# Patient Record
Sex: Female | Born: 1935 | Race: White | Hispanic: No | Marital: Married | Smoking: Never smoker
Health system: Southern US, Community
[De-identification: ages and names within clinical notes are randomized; demographics above are authoritative.]

## PROBLEM LIST (undated history)

## (undated) DIAGNOSIS — R51 Headache: Secondary | ICD-10-CM

## (undated) DIAGNOSIS — R05 Cough: Secondary | ICD-10-CM

## (undated) DIAGNOSIS — R519 Headache, unspecified: Secondary | ICD-10-CM

## (undated) DIAGNOSIS — I10 Essential (primary) hypertension: Secondary | ICD-10-CM

## (undated) DIAGNOSIS — B029 Zoster without complications: Secondary | ICD-10-CM

## (undated) DIAGNOSIS — G629 Polyneuropathy, unspecified: Secondary | ICD-10-CM

## (undated) DIAGNOSIS — R053 Chronic cough: Secondary | ICD-10-CM

## (undated) DIAGNOSIS — R600 Localized edema: Secondary | ICD-10-CM

## (undated) DIAGNOSIS — G709 Myoneural disorder, unspecified: Secondary | ICD-10-CM

## (undated) DIAGNOSIS — R002 Palpitations: Secondary | ICD-10-CM

## (undated) DIAGNOSIS — K219 Gastro-esophageal reflux disease without esophagitis: Secondary | ICD-10-CM

## (undated) DIAGNOSIS — R32 Unspecified urinary incontinence: Secondary | ICD-10-CM

## (undated) HISTORY — DX: Cough: R05

## (undated) HISTORY — DX: Polyneuropathy, unspecified: G62.9

## (undated) HISTORY — DX: Unspecified urinary incontinence: R32

## (undated) HISTORY — DX: Zoster without complications: B02.9

## (undated) HISTORY — DX: Chronic cough: R05.3

## (undated) HISTORY — DX: Headache: R51

## (undated) HISTORY — DX: Headache, unspecified: R51.9

## (undated) HISTORY — PX: TONSILLECTOMY: SUR1361

## (undated) HISTORY — DX: Localized edema: R60.0

## (undated) HISTORY — DX: Palpitations: R00.2

## (undated) HISTORY — DX: Essential (primary) hypertension: I10

## (undated) HISTORY — PX: TOE AMPUTATION: SHX809

---

## 1998-08-06 ENCOUNTER — Other Ambulatory Visit: Admission: RE | Admit: 1998-08-06 | Discharge: 1998-08-06 | Payer: Self-pay | Admitting: Gynecology

## 1998-12-02 ENCOUNTER — Ambulatory Visit: Admission: RE | Admit: 1998-12-02 | Discharge: 1998-12-02 | Payer: Self-pay | Admitting: Internal Medicine

## 1999-09-24 ENCOUNTER — Other Ambulatory Visit: Admission: RE | Admit: 1999-09-24 | Discharge: 1999-09-24 | Payer: Self-pay | Admitting: Gynecology

## 2000-09-26 ENCOUNTER — Other Ambulatory Visit: Admission: RE | Admit: 2000-09-26 | Discharge: 2000-09-26 | Payer: Self-pay | Admitting: Gynecology

## 2001-09-27 ENCOUNTER — Other Ambulatory Visit: Admission: RE | Admit: 2001-09-27 | Discharge: 2001-09-27 | Payer: Self-pay | Admitting: Gynecology

## 2002-10-01 ENCOUNTER — Other Ambulatory Visit: Admission: RE | Admit: 2002-10-01 | Discharge: 2002-10-01 | Payer: Self-pay | Admitting: Gynecology

## 2003-07-06 HISTORY — PX: COLONOSCOPY: SHX174

## 2003-10-04 ENCOUNTER — Other Ambulatory Visit: Admission: RE | Admit: 2003-10-04 | Discharge: 2003-10-04 | Payer: Self-pay | Admitting: Gynecology

## 2004-02-21 ENCOUNTER — Encounter: Admission: RE | Admit: 2004-02-21 | Discharge: 2004-02-21 | Payer: Self-pay | Admitting: Internal Medicine

## 2004-05-21 ENCOUNTER — Ambulatory Visit: Payer: Self-pay | Admitting: Internal Medicine

## 2004-06-10 ENCOUNTER — Ambulatory Visit: Payer: Self-pay | Admitting: Internal Medicine

## 2004-07-13 ENCOUNTER — Ambulatory Visit: Payer: Self-pay | Admitting: Internal Medicine

## 2004-08-10 ENCOUNTER — Ambulatory Visit: Payer: Self-pay | Admitting: Internal Medicine

## 2004-09-04 ENCOUNTER — Encounter: Admission: RE | Admit: 2004-09-04 | Discharge: 2004-09-04 | Payer: Self-pay | Admitting: Neurology

## 2005-05-12 ENCOUNTER — Ambulatory Visit: Payer: Self-pay | Admitting: Internal Medicine

## 2005-10-14 ENCOUNTER — Ambulatory Visit: Payer: Self-pay | Admitting: Internal Medicine

## 2005-12-03 ENCOUNTER — Other Ambulatory Visit: Admission: RE | Admit: 2005-12-03 | Discharge: 2005-12-03 | Payer: Self-pay | Admitting: Gynecology

## 2006-02-22 ENCOUNTER — Ambulatory Visit: Payer: Self-pay | Admitting: Internal Medicine

## 2006-05-19 ENCOUNTER — Ambulatory Visit: Payer: Self-pay | Admitting: Internal Medicine

## 2007-03-03 ENCOUNTER — Encounter: Payer: Self-pay | Admitting: Internal Medicine

## 2007-03-28 ENCOUNTER — Encounter: Payer: Self-pay | Admitting: Internal Medicine

## 2007-04-04 ENCOUNTER — Ambulatory Visit: Payer: Self-pay | Admitting: Internal Medicine

## 2007-04-04 DIAGNOSIS — R109 Unspecified abdominal pain: Secondary | ICD-10-CM | POA: Insufficient documentation

## 2007-04-04 DIAGNOSIS — N959 Unspecified menopausal and perimenopausal disorder: Secondary | ICD-10-CM | POA: Insufficient documentation

## 2007-04-04 DIAGNOSIS — R209 Unspecified disturbances of skin sensation: Secondary | ICD-10-CM

## 2007-05-03 ENCOUNTER — Ambulatory Visit: Payer: Self-pay | Admitting: Internal Medicine

## 2007-12-18 ENCOUNTER — Encounter: Payer: Self-pay | Admitting: Internal Medicine

## 2008-04-01 ENCOUNTER — Encounter: Payer: Self-pay | Admitting: Internal Medicine

## 2008-04-02 ENCOUNTER — Ambulatory Visit: Payer: Self-pay | Admitting: Internal Medicine

## 2008-04-02 DIAGNOSIS — R609 Edema, unspecified: Secondary | ICD-10-CM

## 2008-04-03 LAB — CONVERTED CEMR LAB
ALT: 13 units/L (ref 0–35)
ALT: 17 units/L (ref 0–35)
AST: 20 units/L (ref 0–37)
Albumin: 3.6 g/dL (ref 3.5–5.2)
Alkaline Phosphatase: 37 units/L — ABNORMAL LOW (ref 39–117)
BUN: 12 mg/dL (ref 6–23)
BUN: 13 mg/dL (ref 6–23)
Basophils Absolute: 0 10*3/uL (ref 0.0–0.1)
Bilirubin, Direct: 0.1 mg/dL (ref 0.0–0.3)
Chloride: 101 meq/L (ref 96–112)
Cholesterol: 183 mg/dL (ref 0–200)
Eosinophils Absolute: 0 10*3/uL (ref 0.0–0.7)
GFR calc non Af Amer: 75 mL/min
Glucose, Bld: 85 mg/dL (ref 70–99)
HCT: 36.5 % (ref 36.0–46.0)
HCT: 36.9 % (ref 36.0–46.0)
HDL: 63 mg/dL (ref >39.0)
HDL: 69.9 mg/dL (ref >39.0)
Hemoglobin: 12.6 g/dL (ref 12.0–15.0)
LDL Cholesterol: 109 mg/dL — ABNORMAL HIGH (ref 0–99)
Lymphocytes Relative: 35.9 % (ref 12.0–46.0)
MCHC: 34.6 g/dL (ref 30.0–36.0)
Neutro Abs: 3.3 10*3/uL (ref 1.4–7.7)
Neutro Abs: 4.3 10*3/uL (ref 1.4–7.7)
Platelets: 249 10*3/uL (ref 150–400)
Platelets: 280 10*3/uL (ref 150–400)
Potassium: 3.9 meq/L (ref 3.5–5.1)
RBC: 3.83 M/uL — ABNORMAL LOW (ref 3.87–5.11)
RBC: 3.86 M/uL — ABNORMAL LOW (ref 3.87–5.11)
RDW: 12.9 % (ref 11.5–14.6)
Total Bilirubin: 0.5 mg/dL (ref 0.3–1.2)
Total CHOL/HDL Ratio: 2.7
Total Protein: 6.4 g/dL (ref 6.0–8.3)
Triglycerides: 41 mg/dL (ref 0–149)
WBC: 6.1 10*3/uL (ref 4.5–10.5)

## 2008-05-28 ENCOUNTER — Ambulatory Visit: Payer: Self-pay | Admitting: Internal Medicine

## 2008-07-02 ENCOUNTER — Ambulatory Visit: Payer: Self-pay | Admitting: Family Medicine

## 2008-08-16 ENCOUNTER — Ambulatory Visit: Payer: Self-pay | Admitting: Internal Medicine

## 2008-08-16 DIAGNOSIS — K591 Functional diarrhea: Secondary | ICD-10-CM | POA: Insufficient documentation

## 2008-10-24 ENCOUNTER — Ambulatory Visit: Payer: Self-pay | Admitting: Family Medicine

## 2008-10-25 LAB — CONVERTED CEMR LAB
BUN: 14 mg/dL (ref 6–23)
Calcium: 9.5 mg/dL (ref 8.4–10.5)
GFR calc non Af Amer: 87.73 mL/min (ref >60)
Glucose, Bld: 102 mg/dL — ABNORMAL HIGH (ref 70–99)

## 2008-11-27 ENCOUNTER — Encounter: Payer: Self-pay | Admitting: Internal Medicine

## 2008-12-27 ENCOUNTER — Encounter: Payer: Self-pay | Admitting: Internal Medicine

## 2008-12-30 ENCOUNTER — Ambulatory Visit: Payer: Self-pay | Admitting: Internal Medicine

## 2008-12-30 DIAGNOSIS — I10 Essential (primary) hypertension: Secondary | ICD-10-CM

## 2008-12-30 DIAGNOSIS — R519 Headache, unspecified: Secondary | ICD-10-CM | POA: Insufficient documentation

## 2008-12-30 DIAGNOSIS — R51 Headache: Secondary | ICD-10-CM

## 2009-01-08 ENCOUNTER — Ambulatory Visit: Payer: Self-pay | Admitting: Internal Medicine

## 2009-01-08 DIAGNOSIS — R05 Cough: Secondary | ICD-10-CM

## 2009-02-03 ENCOUNTER — Ambulatory Visit: Payer: Self-pay | Admitting: Internal Medicine

## 2009-03-07 ENCOUNTER — Ambulatory Visit: Payer: Self-pay | Admitting: Internal Medicine

## 2009-03-27 ENCOUNTER — Telehealth: Payer: Self-pay | Admitting: Internal Medicine

## 2009-04-03 ENCOUNTER — Ambulatory Visit: Payer: Self-pay | Admitting: Pulmonary Disease

## 2009-04-28 ENCOUNTER — Ambulatory Visit: Payer: Self-pay | Admitting: Pulmonary Disease

## 2009-05-01 ENCOUNTER — Ambulatory Visit: Payer: Self-pay | Admitting: Internal Medicine

## 2009-05-12 ENCOUNTER — Telehealth (INDEPENDENT_AMBULATORY_CARE_PROVIDER_SITE_OTHER): Payer: Self-pay | Admitting: *Deleted

## 2009-05-19 ENCOUNTER — Telehealth: Payer: Self-pay | Admitting: Pulmonary Disease

## 2009-05-23 ENCOUNTER — Telehealth: Payer: Self-pay | Admitting: Internal Medicine

## 2009-05-26 ENCOUNTER — Encounter (INDEPENDENT_AMBULATORY_CARE_PROVIDER_SITE_OTHER): Payer: Self-pay | Admitting: *Deleted

## 2009-05-27 ENCOUNTER — Encounter: Payer: Self-pay | Admitting: Internal Medicine

## 2009-05-28 ENCOUNTER — Ambulatory Visit: Payer: Self-pay | Admitting: Internal Medicine

## 2009-05-28 LAB — CONVERTED CEMR LAB
Chloride: 101 meq/L (ref 96–112)
GFR calc non Af Amer: 65.53 mL/min (ref >60)
Sodium: 138 meq/L (ref 135–145)

## 2009-06-02 ENCOUNTER — Encounter (INDEPENDENT_AMBULATORY_CARE_PROVIDER_SITE_OTHER): Payer: Self-pay | Admitting: *Deleted

## 2009-06-03 ENCOUNTER — Telehealth: Payer: Self-pay | Admitting: Internal Medicine

## 2009-07-10 ENCOUNTER — Telehealth: Payer: Self-pay | Admitting: Internal Medicine

## 2009-07-21 ENCOUNTER — Telehealth (INDEPENDENT_AMBULATORY_CARE_PROVIDER_SITE_OTHER): Payer: Self-pay | Admitting: *Deleted

## 2009-08-20 ENCOUNTER — Ambulatory Visit (HOSPITAL_BASED_OUTPATIENT_CLINIC_OR_DEPARTMENT_OTHER): Admission: RE | Admit: 2009-08-20 | Discharge: 2009-08-20 | Payer: Self-pay | Admitting: Gynecology

## 2009-08-22 ENCOUNTER — Ambulatory Visit: Payer: Self-pay | Admitting: Internal Medicine

## 2009-09-08 ENCOUNTER — Encounter: Payer: Self-pay | Admitting: Internal Medicine

## 2009-10-06 ENCOUNTER — Ambulatory Visit: Payer: Self-pay | Admitting: Family Medicine

## 2009-10-15 ENCOUNTER — Ambulatory Visit: Payer: Self-pay | Admitting: Internal Medicine

## 2009-11-06 ENCOUNTER — Telehealth: Payer: Self-pay | Admitting: Internal Medicine

## 2009-12-29 ENCOUNTER — Encounter: Payer: Self-pay | Admitting: Internal Medicine

## 2010-02-18 ENCOUNTER — Ambulatory Visit: Payer: Self-pay | Admitting: Family Medicine

## 2010-02-18 DIAGNOSIS — B029 Zoster without complications: Secondary | ICD-10-CM | POA: Insufficient documentation

## 2010-02-27 ENCOUNTER — Telehealth: Payer: Self-pay | Admitting: Family Medicine

## 2010-03-05 ENCOUNTER — Telehealth: Payer: Self-pay | Admitting: Family Medicine

## 2010-03-12 ENCOUNTER — Ambulatory Visit: Payer: Self-pay | Admitting: Internal Medicine

## 2010-03-24 ENCOUNTER — Telehealth: Payer: Self-pay | Admitting: Internal Medicine

## 2010-05-09 IMAGING — CR DG CHEST 2V
2 series · 2 of 2 positions shown · non-contrast
Comparison: 02/20/2004

CLINICAL DATA: Cough.

CHEST - 2 VIEW

[view not recorded (1 of 2)]
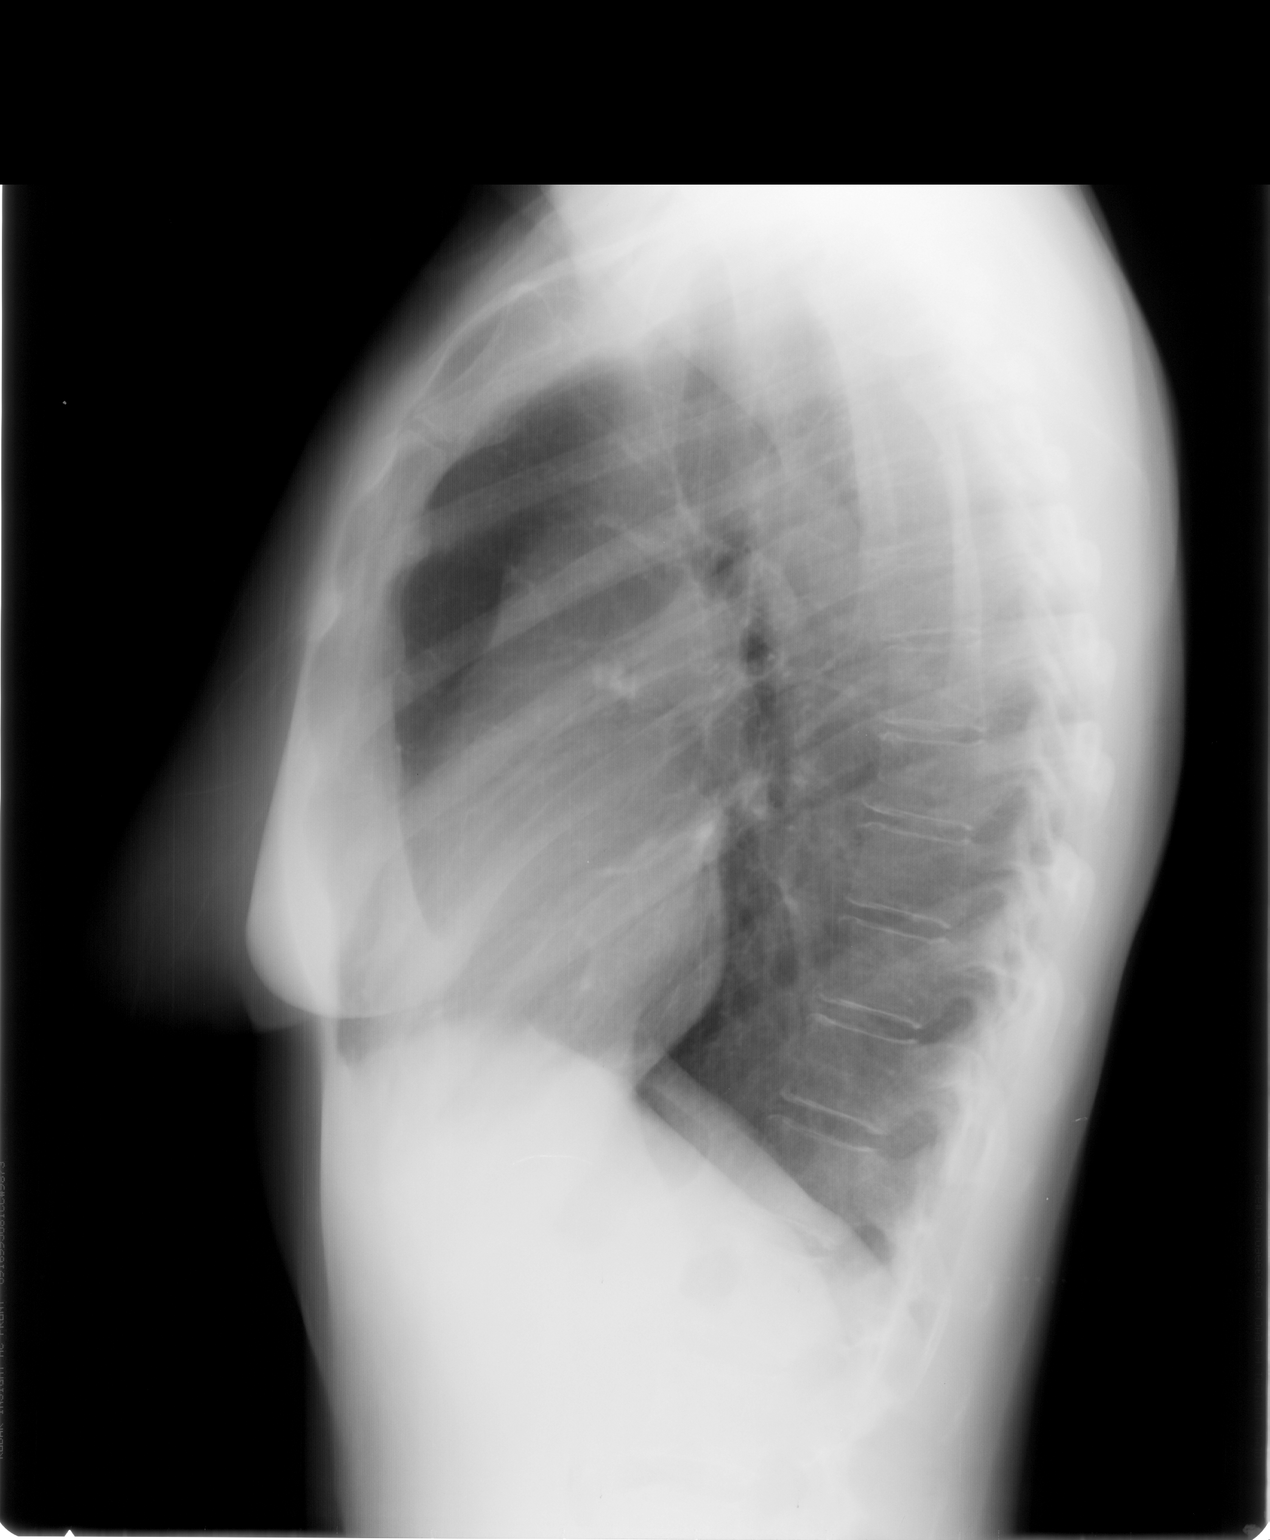

[view not recorded (2 of 2)]
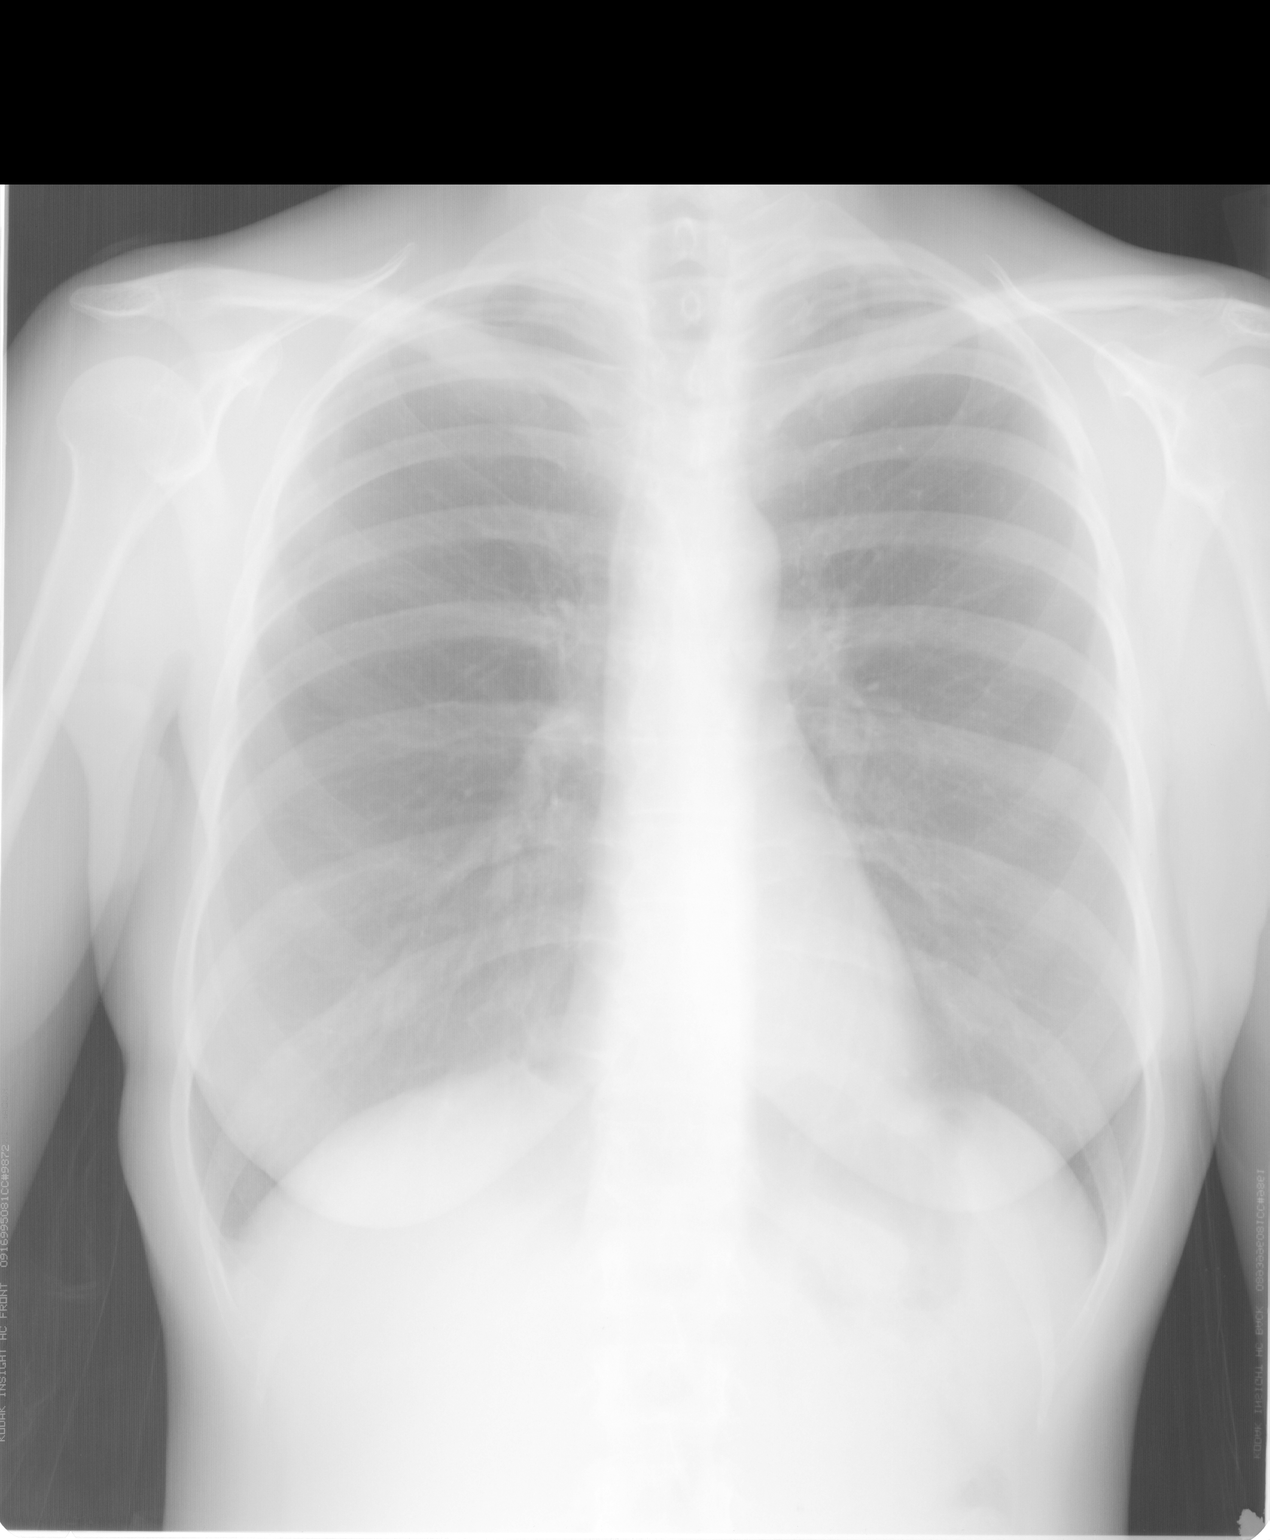

[2 of 2 positions shown; findings below may reference images not displayed]

FINDINGS: Trachea is midline.  Heart size normal.  There may be a
calcified right hilar lymph node.  Mild biapical pleural
parenchymal scarring.  Lungs are otherwise clear.  No pleural
fluid.
IMPRESSION: No acute findings.

## 2010-06-19 ENCOUNTER — Ambulatory Visit: Payer: Self-pay | Admitting: Internal Medicine

## 2010-06-22 ENCOUNTER — Telehealth: Payer: Self-pay | Admitting: Internal Medicine

## 2010-07-05 HISTORY — PX: MYOMECTOMY VAGINAL APPROACH: SUR871

## 2010-07-15 ENCOUNTER — Other Ambulatory Visit: Payer: Self-pay | Admitting: Internal Medicine

## 2010-07-15 ENCOUNTER — Encounter: Payer: Self-pay | Admitting: Internal Medicine

## 2010-07-15 LAB — CBC WITH DIFFERENTIAL/PLATELET
Basophils Absolute: 0 10*3/uL (ref 0.0–0.1)
Basophils Relative: 0.6 % (ref 0.0–3.0)
Eosinophils Absolute: 0.1 10*3/uL (ref 0.0–0.7)
Eosinophils Relative: 0.7 % (ref 0.0–5.0)
HCT: 39.6 % (ref 36.0–46.0)
Hemoglobin: 13.3 g/dL (ref 12.0–15.0)
Lymphocytes Relative: 27.9 % (ref 12.0–46.0)
Lymphs Abs: 2.3 10*3/uL (ref 0.7–4.0)
MCHC: 33.5 g/dL (ref 30.0–36.0)
MCV: 96.7 fl (ref 78.0–100.0)
Monocytes Absolute: 0.5 10*3/uL (ref 0.1–1.0)
Monocytes Relative: 6.8 % (ref 3.0–12.0)
Neutro Abs: 5.2 10*3/uL (ref 1.4–7.7)
Neutrophils Relative %: 64 % (ref 43.0–77.0)
Platelets: 334 10*3/uL (ref 150.0–400.0)
RBC: 4.1 Mil/uL (ref 3.87–5.11)
RDW: 13.5 % (ref 11.5–14.6)
WBC: 8.1 10*3/uL (ref 4.5–10.5)

## 2010-07-15 LAB — LIPID PANEL
Cholesterol: 183 mg/dL (ref 0–200)
HDL: 66.7 mg/dL (ref >39.00)
LDL Cholesterol: 109 mg/dL — ABNORMAL HIGH (ref 0–99)
Total CHOL/HDL Ratio: 3
Triglycerides: 38 mg/dL (ref 0.0–149.0)
VLDL: 7.6 mg/dL (ref 0.0–40.0)

## 2010-07-15 LAB — BASIC METABOLIC PANEL
BUN: 17 mg/dL (ref 6–23)
CO2: 31 mEq/L (ref 19–32)
Calcium: 9.5 mg/dL (ref 8.4–10.5)
Chloride: 103 mEq/L (ref 96–112)
Creatinine, Ser: 0.8 mg/dL (ref 0.4–1.2)
GFR: 78.21 mL/min (ref >60.00)
Glucose, Bld: 88 mg/dL (ref 70–99)
Potassium: 5.1 mEq/L (ref 3.5–5.1)
Sodium: 141 mEq/L (ref 135–145)

## 2010-07-15 LAB — HEPATIC FUNCTION PANEL
ALT: 13 U/L (ref 0–35)
AST: 19 U/L (ref 0–37)
Albumin: 4 g/dL (ref 3.5–5.2)
Alkaline Phosphatase: 46 U/L (ref 39–117)
Bilirubin, Direct: 0.1 mg/dL (ref 0.0–0.3)
Total Bilirubin: 0.7 mg/dL (ref 0.3–1.2)
Total Protein: 7.3 g/dL (ref 6.0–8.3)

## 2010-07-15 LAB — TSH: TSH: 2.02 u[IU]/mL (ref 0.35–5.50)

## 2010-08-02 LAB — CONVERTED CEMR LAB
AST: 23 units/L (ref 0–37)
Albumin: 4 g/dL (ref 3.5–5.2)
Bilirubin, Direct: 0 mg/dL (ref 0.0–0.3)
CO2: 28 meq/L (ref 19–32)
Calcium: 8.6 mg/dL (ref 8.4–10.5)
Cholesterol: 181 mg/dL (ref 0–200)
Eosinophils Absolute: 0 10*3/uL (ref 0.0–0.7)
Eosinophils Relative: 0.7 % (ref 0.0–5.0)
HDL: 62.4 mg/dL (ref >39.00)
LDL Cholesterol: 101 mg/dL — ABNORMAL HIGH (ref 0–99)
MCHC: 35.2 g/dL (ref 30.0–36.0)
MCV: 98 fL (ref 78.0–100.0)
Monocytes Absolute: 0.6 10*3/uL (ref 0.1–1.0)
Neutro Abs: 4 10*3/uL (ref 1.4–7.7)
Neutrophils Relative %: 58.2 % (ref 43.0–77.0)
Platelets: 265 10*3/uL (ref 150.0–400.0)
Potassium: 4.5 meq/L (ref 3.5–5.1)
RBC: 3.57 M/uL — ABNORMAL LOW (ref 3.87–5.11)
RDW: 12.9 % (ref 11.5–14.6)
TSH: 2.29 microintl units/mL (ref 0.35–5.50)
Total CHOL/HDL Ratio: 3
Total Protein: 7.2 g/dL (ref 6.0–8.3)
VLDL: 18 mg/dL (ref 0.0–40.0)

## 2010-08-04 NOTE — Progress Notes (Signed)
Summary: Pt req new script for HCTZ or water pill  Phone Note Call from Patient Call back at Home Phone [phone]   Caller: Patient Summary of Call: Pt is req a new script for HCTZ or another fluid pill. Pt says that they are starting to retain a lot of fluid again. Please call in to [facility].  Initial call taken by: Lucy Antigua,  July 10, 2009 11:03 AM  Follow-up for Phone Call        HCTZ 25 mg, number 90- one  every morning Follow-up by: Gordy Savers  MD,  July 10, 2009 12:48 PM  Additional Follow-up for Phone Call Additional follow up Details #1::        Rx Called In Additional Follow-up by: Raechel Ache, RN,  July 10, 2009 1:06 PM    New/Updated Medications: HYDROCHLOROTHIAZIDE 25 MG TABS (HYDROCHLOROTHIAZIDE) 1 once daily Prescriptions: HYDROCHLOROTHIAZIDE 25 MG TABS (HYDROCHLOROTHIAZIDE) 1 once daily  #90 x 2   Entered by:   Raechel Ache, RN   Authorized by:   Gordy Savers  MD   Signed by:   Raechel Ache, RN on 07/10/2009   Method used:   Electronically to        [facility]* (retail)       [address]       [place], [place]  [id number]       Ph: [phone]       Fax: [phone]   RxID:   [id number] HYDROCHLOROTHIAZIDE 25 MG TABS (HYDROCHLOROTHIAZIDE) 1 once daily  #90 x 2   Entered by:   Raechel Ache, RN   Authorized by:   Gordy Savers  MD   Signed by:   Raechel Ache, RN on 07/10/2009   Method used:   Print then Give to Patient   RxID:   [id number]

## 2010-08-04 NOTE — Assessment & Plan Note (Signed)
Summary: shingles/dm   Vital Signs:  Patient profile:   75 year old female Weight:      156 pounds Temp:     97.6 degrees F oral BP sitting:   110 / 72  (right arm) Cuff size:   regular  Vitals Entered By: Duard Brady LPN (March 12, 2010 11:44 AM) CC: c/o shingles - burning , spreading Is Patient Diabetic? No   Primary Care Provider:  Gordy Savers  MD  CC:  c/o shingles - burning  and spreading.  History of Present Illness: 75 year-old patient who is seen today for follow-up.  She was seen earlier with possible recurrent shingles.  She had a vesicular rash involving her left posterior shoulder region.  This has healed and she now complains of some fairly generalized paresthesias described as a burning sensation.  These are not well localized.  She has a history of similar paresthesias in the past and seen by neurology and placed on nortriptyline.  She has treated hypertension  Allergies: 1)  ! * Singular 2)  ! * Zyrtec 3)  ! * Fluticason Propionate 4)  Cipro 5)  Naprosyn (Naproxen)  Physical Exam  General:  Well-developed,well-nourished,in no acute distress; alert,appropriate and cooperative throughout examination; normal blood pressure Neck:  No deformities, masses, or tenderness noted. Lungs:  Normal respiratory effort, chest expands symmetrically. Lungs are clear to auscultation, no crackles or wheezes. Heart:  Normal rate and regular rhythm. S1 and S2 normal without gallop, murmur, click, rub or other extra sounds. Skin:  no dermatitis   Impression & Recommendations:  Problem # 1:  HYPERTENSION (ICD-401.9)  Her updated medication list for this problem includes:    Micardis 40 Mg Tabs (Telmisartan) .Marland Kitchen... 1 once daily    Aldactone 25 Mg Tabs (Spironolactone) ..... One daily  Her updated medication list for this problem includes:    Micardis 40 Mg Tabs (Telmisartan) .Marland Kitchen... 1 once daily    Aldactone 25 Mg Tabs (Spironolactone) ..... One  daily  Problem # 2:  DYSESTHESIA (ICD-782.0)  Complete Medication List: 1)  Aspirin Ec 81 Mg Tbec (Aspirin) .... Take 1 tablet by mouth once a day 2)  Prometrium 200 Mg Caps (Progesterone micronized) .... Take days 1-12 every other month 3)  Vivelle 0.05 Mg/24hr Pttw (Estradiol) .... Apply 2x week 4)  Premarin 0.625 Mg/gm Crea (Estrogens, conjugated) .... Use 2x week 5)  Chlorpheniramine Maleate 4 Mg Tabs (Chlorpheniramine maleate) .... Take 2 tabs by mouth at bedtime 6)  Micardis 40 Mg Tabs (Telmisartan) .Marland Kitchen.. 1 once daily 7)  Aldactone 25 Mg Tabs (Spironolactone) .... One daily 8)  Furosemide 20 Mg Tabs (furosemide)  .... One by mouth q day 9)  Vitamin D 1000 Unit Tabs (Cholecalciferol) .... Qd 10)  Caltrate 600 1500 Mg Tabs (Calcium carbonate) 11)  Amitriptyline Hcl 10 Mg Tabs (Amitriptyline hcl) .Marland Kitchen.. 1 tab by mouth at bedtime as needed pain 12)  Valacyclovir Hcl 500 Mg Tabs (Valacyclovir hcl) .Marland Kitchen.. 1 tablet by mouth three times a day x7 days  Patient Instructions: 1)  Please schedule a follow-up appointment in 4 months. 2)  Limit your Sodium (Salt) to less than 2 grams a day(slightly less than 1/2 a teaspoon) to prevent fluid retention, swelling, or worsening of symptoms. 3)  It is important that you exercise regularly at least 20 minutes 5 times a week. If you develop chest pain, have severe difficulty breathing, or feel very tired , stop exercising immediately and seek medical attention. Prescriptions: FUROSEMIDE  20 MG TABS (FUROSEMIDE) one by mouth q day  #90 x 2   Entered and Authorized by:   Gordy Savers  MD   Signed by:   Gordy Savers  MD on 03/12/2010   Method used:   Print then Give to Patient   RxID:   [id number]

## 2010-08-04 NOTE — Assessment & Plan Note (Signed)
Summary: ? shingles//ccm   Vital Signs:  Patient profile:   75 year old female Temp:     98.0 degrees F oral BP sitting:   140 / 80  (left arm) Cuff size:   regular  Vitals Entered By: Sid Falcon LPN (October 06, 1608 4:24 PM) CC: pt questioning shingles   History of Present Illness: Patient seen with burning stinging pain right thoracic region but no rash. History of similar symptoms in the past with shingles. Symptoms present for several days possibly over a week. Advil helps slightly. Symptoms relatively mild at this time. Denies any cough, dyspnea, or pleuritic pain.  Allergies: 1)  Cipro 2)  Naprosyn (Naproxen)  Past History:  Past Medical History: Last updated: 03/07/2009 Dysesthesias Menopausal syndrome Palpitations, hx history of shingles gravida two, para two, abortus zero pedal edema Headache Hypertension chronic cough PMH reviewed for relevance  Review of Systems  The patient denies anorexia, fever, weight loss, chest pain, syncope, dyspnea on exertion, prolonged cough, and hemoptysis.    Physical Exam  General:  Well-developed,well-nourished,in no acute distress; alert,appropriate and cooperative throughout examination Lungs:  Normal respiratory effort, chest expands symmetrically. Lungs are clear to auscultation, no crackles or wheezes. Heart:  normal rate and regular rhythm.   Msk:  slightly tender R thoracic region. Skin:  no visible rash   Impression & Recommendations:  Problem # 1:  DYSESTHESIA (ICD-782.0) patient has neuropathic-type pain right thoracic area but no clinical evidence for shingles at this time. Discussed possible treatment options but at this point she wishes to continue with over-the-counter Tylenol and Advil which are giving sufficient pain relief.  Complete Medication List: 1)  Aspirin Ec 81 Mg Tbec (Aspirin) .... Take 1 tablet by mouth once a day 2)  Prometrium 200 Mg Caps (Progesterone micronized) .... Take days 1-12 every  other month 3)  Vivelle 0.05 Mg/24hr Pttw (Estradiol) .... Apply 2x week 4)  Premarin 0.625 Mg/gm Crea (Estrogens, conjugated) .... Use 2x week 5)  Hydrocodone-homatropine 5-1.5 Mg/48ml Syrp (Hydrocodone-homatropine) .Marland Kitchen.. 1 teaspoon every 6 hours as needed for cough 6)  Omeprazole 40 Mg Cpdr (Omeprazole) .... Take one tablet once daily 7)  Chlorpheniramine Maleate 4 Mg Tabs (Chlorpheniramine maleate) .... Take 2 tabs by mouth at bedtime 8)  Astepro 0.15 % Soln (Azelastine hcl) .... 2 puffs each nostril in the am 9)  Micardis 40 Mg Tabs (Telmisartan) .Marland Kitchen.. 1 once daily 10)  Aldactone 25 Mg Tabs (Spironolactone) .... One daily 11)  Furosemide 20 Mg Tabs (Furosemide) .... One daily

## 2010-08-04 NOTE — Progress Notes (Signed)
Summary: edema  Phone Note Call from Patient   Caller: Patient Call For: Gordy Savers  MD Summary of Call: Both feet and ankles and hands are swelling.  Taking Lasix 20 mg. one by mouth daily. Wants to increase dosage...... [phone]  Initial call taken by: Lynann Beaver CMA,  Nov 06, 2009 11:05 AM  Follow-up for Phone Call        OK to increase to 40 mg daily Follow-up by: Gordy Savers  MD,  Nov 06, 2009 12:55 PM    New/Updated Medications: FUROSEMIDE 40 MG TABS (FUROSEMIDE) one by mouth q day   Pt. notified.

## 2010-08-04 NOTE — Assessment & Plan Note (Signed)
Summary: FEET SWOLLEN CAN'T MOVE TOES//SLM   Vital Signs:  Patient profile:   75 year old female Weight:      154 pounds Temp:     98.9 degrees F oral BP sitting:   136 / 70  (right arm) Cuff size:   regular  Vitals Entered By: Duard Brady LPN (August 22, 2009 9:23 AM) CC: c/o edema bilat  feet and legs Is Patient Diabetic? No   Primary Care Provider:  Gordy Savers  MD  CC:  c/o edema bilat  feet and legs.  History of Present Illness: 75 year old patient who is seen today with a chief complaint of worsening pedal edema.  This has been a issue in the past.  Denies any shortness of breath, PND, dyspnea on exertion.  She does moderate her salt intake.  She is two days status post a gynecologic procedure as an outpatient for postmenopausal bleeding.  She has done well postop.  She has treated hypertension, which has been stable.  Hypertensive regimen includes Micardis, and Aldactone.  Preventive Screening-Counseling & Management  Alcohol-Tobacco     Smoking Status: never  Allergies: 1)  Cipro 2)  Naprosyn (Naproxen)  Past History:  Past Medical History: Reviewed history from 03/07/2009 and no changes required. Dysesthesias Menopausal syndrome Palpitations, hx history of shingles gravida two, para two, abortus zero pedal edema Headache Hypertension chronic cough  Review of Systems       The patient complains of peripheral edema.  The patient denies anorexia, fever, weight loss, weight gain, vision loss, decreased hearing, hoarseness, chest pain, syncope, dyspnea on exertion, prolonged cough, headaches, hemoptysis, abdominal pain, melena, hematochezia, severe indigestion/heartburn, hematuria, incontinence, genital sores, muscle weakness, suspicious skin lesions, transient blindness, difficulty walking, depression, unusual weight change, abnormal bleeding, enlarged lymph nodes, angioedema, and breast masses.    Physical Exam  General:   Well-developed,well-nourished,in no acute distress; alert,appropriate and cooperative throughout examination Head:  Normocephalic and atraumatic without obvious abnormalities. No apparent alopecia or balding. Eyes:  No corneal or conjunctival inflammation noted. EOMI. Perrla. Funduscopic exam benign, without hemorrhages, exudates or papilledema. Vision grossly normal. Mouth:  Oral mucosa and oropharynx without lesions or exudates.  Teeth in good repair. Neck:  No deformities, masses, or tenderness noted. Lungs:  Normal respiratory effort, chest expands symmetrically. Lungs are clear to auscultation, no crackles or wheezes. Heart:  Normal rate and regular rhythm. S1 and S2 normal without gallop, murmur, click, rub or other extra sounds. Abdomen:  Bowel sounds positive,abdomen soft and non-tender without masses, organomegaly or hernias noted. Extremities:  2+ left pedal edema and 2+ right pedal edema.  2+ left pedal edema.     Impression & Recommendations:  Problem # 1:  PEDAL EDEMA (ICD-782.3)  Her updated medication list for this problem includes:    Aldactone 25 Mg Tabs (Spironolactone) ..... One daily    Furosemide 20 Mg Tabs (Furosemide) ..... One daily  Her updated medication list for this problem includes:    Aldactone 25 Mg Tabs (Spironolactone) ..... One daily    Furosemide 20 Mg Tabs (Furosemide) ..... One daily  Problem # 2:  HYPERTENSION (ICD-401.9)  Her updated medication list for this problem includes:    Micardis 40 Mg Tabs (Telmisartan) .Marland Kitchen... 1 once daily    Aldactone 25 Mg Tabs (Spironolactone) ..... One daily    Furosemide 20 Mg Tabs (Furosemide) ..... One daily  Her updated medication list for this problem includes:    Micardis 40 Mg Tabs (Telmisartan) .Marland Kitchen... 1 once daily  Aldactone 25 Mg Tabs (Spironolactone) ..... One daily    Furosemide 20 Mg Tabs (Furosemide) ..... One daily  Complete Medication List: 1)  Aspirin Ec 81 Mg Tbec (Aspirin) .... Take 1 tablet by  mouth once a day 2)  Prometrium 200 Mg Caps (Progesterone micronized) .... Take days 1-12 every other month 3)  Vivelle 0.05 Mg/24hr Pttw (Estradiol) .... Apply 2x week 4)  Premarin 0.625 Mg/gm Crea (Estrogens, conjugated) .... Use 2x week 5)  Hydrocodone-homatropine 5-1.5 Mg/19ml Syrp (Hydrocodone-homatropine) .Marland Kitchen.. 1 teaspoon every 6 hours as needed for cough 6)  Omeprazole 40 Mg Cpdr (Omeprazole) .... Take one tablet once daily 7)  Chlorpheniramine Maleate 4 Mg Tabs (Chlorpheniramine maleate) .... Take 2 tabs by mouth at bedtime 8)  Astepro 0.15 % Soln (Azelastine hcl) .... 2 puffs each nostril in the am 9)  Micardis 40 Mg Tabs (Telmisartan) .Marland Kitchen.. 1 once daily 10)  Aldactone 25 Mg Tabs (Spironolactone) .... One daily 11)  Furosemide 20 Mg Tabs (Furosemide) .... One daily  Patient Instructions: 1)  Please schedule a follow-up appointment in 6 months. 2)  Limit your Sodium (Salt). 3)  It is important that you exercise regularly at least 20 minutes 5 times a week. If you develop chest pain, have severe difficulty breathing, or feel very tired , stop exercising immediately and seek medical attention. Prescriptions: FUROSEMIDE 20 MG TABS (FUROSEMIDE) one daily  #90 x 4   Entered and Authorized by:   Gordy Savers  MD   Signed by:   Gordy Savers  MD on 08/22/2009   Method used:   Print then Give to Patient   RxID:   [id number] ALDACTONE 25 MG TABS (SPIRONOLACTONE) One daily  #90 x 4   Entered and Authorized by:   Gordy Savers  MD   Signed by:   Gordy Savers  MD on 08/22/2009   Method used:   Print then Give to Patient   RxID:   [id number] MICARDIS 40 MG TABS (TELMISARTAN) 1 once daily  #90 x 4   Entered and Authorized by:   Gordy Savers  MD   Signed by:   Gordy Savers  MD on 08/22/2009   Method used:   Print then Give to Patient   RxID:   [id number] OMEPRAZOLE 40 MG CPDR (OMEPRAZOLE) take one tablet once daily  #90 x 3   Entered  and Authorized by:   Gordy Savers  MD   Signed by:   Gordy Savers  MD on 08/22/2009   Method used:   Print then Give to Patient   RxID:   [id number]

## 2010-08-04 NOTE — Progress Notes (Signed)
  Phone Note Refill Request Message from:  Fax from Pharmacy on March 05, 2010 1:40 PM  Refills Requested: Medication #1:  VALACYCLOVIR HCL 500 MG TABS 1 tablet by mouth three times a day X7 days.   Dosage confirmed as above?Dosage Confirmed Initial call taken by: Josph Macho RMA,  March 05, 2010 1:41 PM    Prescriptions: VALACYCLOVIR HCL 500 MG TABS (VALACYCLOVIR HCL) 1 tablet by mouth three times a day X7 days  #21 x 0   Entered by:   Josph Macho RMA   Authorized by:   Danise Edge MD   Signed by:   Josph Macho RMA on 03/05/2010   Method used:   Faxed to ...       [facility]* (retail)       [address]       [place], [place]  [address]       Ph: [phone]       Fax: [phone]   RxID:   [id number]

## 2010-08-04 NOTE — Progress Notes (Signed)
Summary: refill of antiviral blyth saw for k  Phone Note Call from Patient Call back at Home Phone [phone]   Call For: blyth saw her when Dr. Kirtland Bouchard out Summary of Call: Has completed antiviral med Valacyclovir hcl 500mg .  Sores still breaking out on other places of body, left shoulder, one center of back, and 3 on left side.   Burning & stinging is better.  Still very sensitive to clothing.  Cannot wear bra, thinks one at bra line.  Should she have a refill of med?   [place].  Allergic to meds, see file.  Initial call taken by: Rudy Jew, RN,  February 27, 2010 9:26 AM  Follow-up for Phone Call        OK to refill once if no relief then needs reeval and likely referral to dermatology. Can increase Amitriptyline to 25mg  at bedtime if pain is keeping her up would give #30, 1 rf (may take 2 of the 10mg  tabs til gone) Follow-up by: Danise Edge MD,  February 27, 2010 9:36 AM  Additional Follow-up for Phone Call Additional follow up Details #1::        Informed pt and she stated she hasn't taken any of the Amitriptyline yet.  Additional Follow-up by: Josph Macho RMA,  February 27, 2010 10:06 AM    New/Updated Medications: VALACYCLOVIR HCL 500 MG TABS (VALACYCLOVIR HCL) 1 tablet by mouth three times a day X7 days Prescriptions: VALACYCLOVIR HCL 500 MG TABS (VALACYCLOVIR HCL) 1 tablet by mouth three times a day X7 days  #21 x 0   Entered by:   Josph Macho RMA   Authorized by:   Danise Edge MD   Signed by:   Josph Macho RMA on 02/27/2010   Method used:   Electronically to        [facility]* (retail)       [address]       [place], [place]  [id number]       Ph: [phone]       Fax: [phone]   RxID:   [id number]

## 2010-08-04 NOTE — Progress Notes (Signed)
Summary: different diuretic  Phone Note Call from Patient Call back at Home Phone [phone]   Caller: vm Call For: kwia Summary of Call: Thinks the HCT since Dec. is the cause of her persistent cough.  Stopped it Thurs & cough is virtually gone.  Requesting different diuretic for some swelling, though it's better than a week ago.   Initial call taken by: Rudy Jew, RN,  July 21, 2009 10:05 AM  Follow-up for Phone Call        Pharmacy is [facility].  Please call.    Additional Follow-up for Phone Call Additional follow up Details #1::        Phone Call Completed Additional Follow-up by: Rudy Jew, RN,  July 21, 2009 4:57 PM    New/Updated Medications: ALDACTONE 25 MG TABS (SPIRONOLACTONE) One daily Prescriptions: ALDACTONE 25 MG TABS (SPIRONOLACTONE) One daily  #90 x 0   Entered by:   Rudy Jew, RN   Authorized by:   Gordy Savers  MD   Signed by:   Rudy Jew, RN on 07/21/2009   Method used:   Electronically to        [facility]* (retail)       [address]       [place], [place]  [address]       Ph: [phone]       Fax: [phone]   RxID:   [id number]  generic aldactone 25  #90 one daily

## 2010-08-04 NOTE — Letter (Signed)
Summary: [facility]  [facility]   Imported By: Maryln Gottron 10/16/2009 10:01:48  _____________________________________________________________________  External Attachment:    Type:   Image     Comment:   External Document

## 2010-08-04 NOTE — Assessment & Plan Note (Signed)
Summary: cough/njr   Vital Signs:  Patient profile:   75 year old female Weight:      148 pounds Temp:     98.5 degrees F oral BP sitting:   150 / 80  (right arm) Cuff size:   regular  Vitals Entered By: Duard Brady LPN (October 15, 2009 9:34 AM) CC: c/o cough  Is Patient Diabetic? No   Primary Care Provider:  Gordy Savers  MD  CC:  c/o cough .  History of Present Illness: 75 year old patient who is seen today for follow-up of her chronic cough.  This has been going on for months and she has had allergy evaluation, as well as pulmonary consultation.  She continues to have postnasal drip, and significant cough through the night and interferes with her sleep.  She has been intolerant of nasal steroids, Zyrtec and Singulair.  There is been no weight loss or other constitutional complaints.  She has treated hypertension, which has been stable.  Allergies: 1)  ! * Singular 2)  ! * Zyrtec 3)  ! * Fluticason Propionate 4)  Cipro 5)  Naprosyn (Naproxen)  Past History:  Past Medical History: Reviewed history from 03/07/2009 and no changes required. Dysesthesias Menopausal syndrome Palpitations, hx history of shingles gravida two, para two, abortus zero pedal edema Headache Hypertension chronic cough  Past Surgical History: Reviewed history from 04/04/2007 and no changes required. Tonsillectomy colonoscopy 2005  Review of Systems       The patient complains of prolonged cough.  The patient denies anorexia, fever, weight loss, weight gain, vision loss, decreased hearing, hoarseness, chest pain, syncope, dyspnea on exertion, peripheral edema, headaches, hemoptysis, abdominal pain, melena, hematochezia, severe indigestion/heartburn, hematuria, incontinence, genital sores, muscle weakness, suspicious skin lesions, transient blindness, difficulty walking, depression, unusual weight change, abnormal bleeding, enlarged lymph nodes, angioedema, and breast masses.     Physical Exam  General:  Well-developed,well-nourished,in no acute distress; alert,appropriate and cooperative throughout examination Head:  Normocephalic and atraumatic without obvious abnormalities. No apparent alopecia or balding. Eyes:  No corneal or conjunctival inflammation noted. EOMI. Perrla. Funduscopic exam benign, without hemorrhages, exudates or papilledema. Vision grossly normal. Ears:  External ear exam shows no significant lesions or deformities.  Otoscopic examination reveals clear canals, tympanic membranes are intact bilaterally without bulging, retraction, inflammation or discharge. Hearing is grossly normal bilaterally. Mouth:  Oral mucosa and oropharynx without lesions or exudates.  Teeth in good repair. Neck:  No deformities, masses, or tenderness noted. Lungs:  Normal respiratory effort, chest expands symmetrically. Lungs are clear to auscultation, no crackles or wheezes. Heart:  Normal rate and regular rhythm. S1 and S2 normal without gallop, murmur, click, rub or other extra sounds. Abdomen:  Bowel sounds positive,abdomen soft and non-tender without masses, organomegaly or hernias noted. Msk:  No deformity or scoliosis noted of thoracic or lumbar spine.     Impression & Recommendations:  Problem # 1:  COUGH (ICD-786.2)  Problem # 2:  HYPERTENSION (ICD-401.9)  Her updated medication list for this problem includes:    Micardis 40 Mg Tabs (Telmisartan) .Marland Kitchen... 1 once daily    Aldactone 25 Mg Tabs (Spironolactone) ..... One daily    Furosemide 20 Mg Tabs (Furosemide) ..... One daily  Her updated medication list for this problem includes:    Micardis 40 Mg Tabs (Telmisartan) .Marland Kitchen... 1 once daily    Aldactone 25 Mg Tabs (Spironolactone) ..... One daily    Furosemide 20 Mg Tabs (Furosemide) ..... One daily  Complete Medication List: 1)  Aspirin Ec 81 Mg Tbec (Aspirin) .... Take 1 tablet by mouth once a day 2)  Prometrium 200 Mg Caps (Progesterone micronized) ....  Take days 1-12 every other month 3)  Vivelle 0.05 Mg/24hr Pttw (Estradiol) .... Apply 2x week 4)  Premarin 0.625 Mg/gm Crea (Estrogens, conjugated) .... Use 2x week 5)  Hydrocodone-homatropine 5-1.5 Mg/57ml Syrp (Hydrocodone-homatropine) .Marland Kitchen.. 1 teaspoon every 6 hours as needed for cough 6)  Omeprazole 40 Mg Cpdr (Omeprazole) .... Take one tablet once daily 7)  Chlorpheniramine Maleate 4 Mg Tabs (Chlorpheniramine maleate) .... Take 2 tabs by mouth at bedtime 8)  Astepro 0.15 % Soln (Azelastine hcl) .... 2 puffs each nostril in the am 9)  Micardis 40 Mg Tabs (Telmisartan) .Marland Kitchen.. 1 once daily 10)  Aldactone 25 Mg Tabs (Spironolactone) .... One daily 11)  Furosemide 20 Mg Tabs (Furosemide) .... One daily 12)  Vitamin D 1000 Unit Tabs (Cholecalciferol) .... Qd 13)  Caltrate 600 1500 Mg Tabs (Calcium carbonate)  Patient Instructions: 1)  pulmonary follow-up if needed 2)  trial  Alavert, one daily 3)  Mucinex DM one twice daily 4)  Please schedule a follow-up appointment as needed. Prescriptions: HYDROCODONE-HOMATROPINE 5-1.5 MG/5ML SYRP (HYDROCODONE-HOMATROPINE) 1 teaspoon every 6 hours as needed for cough  #6 oz x 4   Entered and Authorized by:   Gordy Savers  MD   Signed by:   Gordy Savers  MD on 10/15/2009   Method used:   Print then Give to Patient   RxID:   [id number]

## 2010-08-04 NOTE — Letter (Signed)
Summary: Gynecology-Dr. Beather Arbour  Gynecology-Dr. Beather Arbour   Imported By: Maryln Gottron 01/02/2010 13:48:40  _____________________________________________________________________  External Attachment:    Type:   Image     Comment:   External Document

## 2010-08-04 NOTE — Progress Notes (Signed)
Summary: REFILL REQUEST / REQUEST FOR RETURN CALL  Phone Note Refill Request Message from:  Patient on March 24, 2010 12:59 PM  Refills Requested: Medication #1:  VALACYCLOVIR HCL 500 MG TABS 1 tablet by mouth three times a day X7 days.   Notes: [facility]...Marland KitchenMarland KitchenSee note below.  Pt adv she is still having sxs of burning, recurring blisters, cannot get comfortable.... feels like skin is on fire.... Pt would like to have Rx for med sent to [facility].   Initial call taken by: Debbra Riding,  March 24, 2010 1:00 PM Caller: Patient    [phone] Call For: Gordy Savers  MD Summary of Call: Pt adv she is still having sxs of burning, recurring blisters, cannot get comfortable.... feels like skin is on fire.Marland KitchenMarland KitchenMarland KitchenPt adv that she still has Rx for med: Amitriptyline and hasn't tried it yet, should she try taking this med to help with pain? Pt had appt today for OV with Dr Kirtland Bouchard but feels like she can't go out of the house due to being so uncomfortable....... Pt would like a return call at  [phone].  Initial call taken by: Debbra Riding,  March 24, 2010 1:03 PM  Follow-up for Phone Call        okay to refill Follow-up by: Gordy Savers  MD,  March 24, 2010 2:03 PM  Additional Follow-up for Phone Call Additional follow up Details #1::        attempt to call pt - ans mach - LMTCB if questions - refill faxed [place] [facility] , ok to take other med given - may help. KIK Additional Follow-up by: Duard Brady LPN,  March 24, 2010 3:26 PM    Prescriptions: VALACYCLOVIR HCL 500 MG TABS (VALACYCLOVIR HCL) 1 tablet by mouth three times a day X7 days  #21 x 0   Entered by:   Duard Brady LPN   Authorized by:   Gordy Savers  MD   Signed by:   Duard Brady LPN on 29/56/2130   Method used:   Faxed to ...       [facility]* (retail)       [address]       [place], [place]  [address]       Ph: [phone]       Fax:  [phone]   RxID:   [id number]

## 2010-08-04 NOTE — Assessment & Plan Note (Signed)
Summary: SHINGLES? // RS   Vital Signs:  Patient profile:   75 year old female Height:      62 inches (157.48 cm) Weight:      152.31 pounds (69.23 kg) BMI:     27.96 O2 Sat:      99 % on Room air Temp:     98.1 degrees F (36.72 degrees C) oral Pulse rate:   66 / minute BP sitting:   152 / 72  (left arm) Cuff size:   regular  Vitals Entered By: Josph Macho RMA (February 18, 2010 3:07 PM)  O2 Flow:  Room air  Serial Vital Signs/Assessments:  Time      Position  BP       Pulse  Resp  Temp     By                     144/74                         Danise Edge MD  CC: Possible shingles/ CF Is Patient Diabetic? No   History of Present Illness: Patient in today for evaluation of some painful lesions on her left posterior shoulder. She has a history of shingles in the past and these lesions are burning some the way those did. They have been present for the past day or so. She also previously had a 2 year period of chronic burning in her skin which was never fully explained but which did respond to Nortriptyline over past 2 years. She continues to struggle with her chronic cough, still denies any reflux, fevers, congestion. Does not take an antihistamine because it gave her a HA.  She is under tremendous stress due to her daughter's recent divorce and her husband's recurrent adenomatous lesion in his biliary duct and is not sleeping well at night. No CP/palp/SOB/GI or GU c/o.  Current Medications (verified): 1)  Aspirin Ec 81 Mg Tbec (Aspirin) .... Take 1 Tablet By Mouth Once A Day 2)  Prometrium 200 Mg Caps (Progesterone Micronized) .... Take Days 1-12 Every Other Month 3)  Vivelle 0.05 Mg/24hr Pttw (Estradiol) .... Apply 2x Week 4)  Premarin 0.625 Mg/gm  Crea (Estrogens, Conjugated) .... Use 2x Week 5)  Chlorpheniramine Maleate 4 Mg Tabs (Chlorpheniramine Maleate) .... Take 2 Tabs By Mouth At Bedtime 6)  Micardis 40 Mg Tabs (Telmisartan) .Marland Kitchen.. 1 Once Daily 7)  Aldactone 25 Mg Tabs  (Spironolactone) .... One Daily 8)  Furosemide 20 Mg Tabs (Furosemide) .... One By Mouth Q Day 9)  Vitamin D 1000 Unit Tabs (Cholecalciferol) .... Qd 10)  Caltrate 600 1500 Mg Tabs (Calcium Carbonate)  Allergies (verified): 1)  ! * Singular 2)  ! * Zyrtec 3)  ! * Fluticason Propionate 4)  Cipro 5)  Naprosyn (Naproxen)  Past History:  Past medical history reviewed for relevance to current acute and chronic problems. Social history (including risk factors) reviewed for relevance to current acute and chronic problems.  Past Medical History: Reviewed history from 03/07/2009 and no changes required. Dysesthesias Menopausal syndrome Palpitations, hx history of shingles gravida two, para two, abortus zero pedal edema Headache Hypertension chronic cough  Social History: Reviewed history from 04/03/2009 and no changes required. Married two adult children Patient never smoked.  pt is a retired Runner, broadcasting/film/video.    Review of Systems      See HPI  Physical Exam  General:  Well-developed,well-nourished,in no acute distress; alert,appropriate and cooperative  throughout examination Ears:  External ear exam shows no significant lesions or deformities.  Otoscopic examination reveals clear canals, tympanic membranes are intact bilaterally without bulging, retraction, inflammation or discharge. Hearing is grossly normal bilaterally. Mouth:  Oral mucosa and oropharynx without lesions or exudates.  Neck:  No deformities, masses, or tenderness noted. Lungs:  Normal respiratory effort, chest expands symmetrically. Lungs are clear to auscultation, no crackles or wheezes. Heart:  Normal rate and regular rhythm. S1 and S2 normal without gallop, murmur, click, rub or other extra sounds. Abdomen:  Bowel sounds positive,abdomen soft and non-tender without masses, organomegaly or hernias noted. Extremities:  No clubbing, cyanosis, edema, or deformity noted  Skin:  row of 4 vesicles on an erythematous base  noted over posterior left shoulder Psych:  Cognition and judgment appear intact. Alert and cooperative with normal attention span and concentration. No apparent delusions, illusions, hallucinations   Impression & Recommendations:  Problem # 1:  SHINGLES (ICD-053.9)  over posterior left shoulder, given rx for Valtrex and Amitriptyline to help with pain and sleep may take some doses of Aleve as needed as well and report worsening symptoms  Orders: Prescription Created Electronically [phone])  Problem # 2:  COUGH (ICD-786.2) Patient hesitant to go for any work up right now as she manages her husbands health concerns but agrees to return in 2 months for further eval. May need to consider ENT evaluation to visual throat and vocal cords as well as a trial of d/c ing Micardis to see if cough resolves  Problem # 3:  HYPERTENSION (ICD-401.9)  Her updated medication list for this problem includes:    Micardis 40 Mg Tabs (Telmisartan) .Marland Kitchen... 1 once daily    Aldactone 25 Mg Tabs (Spironolactone) ..... One daily Mild elevation with acute pain, will have her return for further evaluation  Complete Medication List: 1)  Aspirin Ec 81 Mg Tbec (Aspirin) .... Take 1 tablet by mouth once a day 2)  Prometrium 200 Mg Caps (Progesterone micronized) .... Take days 1-12 every other month 3)  Vivelle 0.05 Mg/24hr Pttw (Estradiol) .... Apply 2x week 4)  Premarin 0.625 Mg/gm Crea (Estrogens, conjugated) .... Use 2x week 5)  Chlorpheniramine Maleate 4 Mg Tabs (Chlorpheniramine maleate) .... Take 2 tabs by mouth at bedtime 6)  Micardis 40 Mg Tabs (Telmisartan) .Marland Kitchen.. 1 once daily 7)  Aldactone 25 Mg Tabs (Spironolactone) .... One daily 8)  Furosemide 20 Mg Tabs (furosemide)  .... One by mouth q day 9)  Vitamin D 1000 Unit Tabs (Cholecalciferol) .... Qd 10)  Caltrate 600 1500 Mg Tabs (Calcium carbonate) 11)  Valtrex 500 Mg Tabs (Valacyclovir hcl) .Marland Kitchen.. 1 tab by mouth three times a day x  7days 12)  Amitriptyline Hcl  10 Mg Tabs (Amitriptyline hcl) .Marland Kitchen.. 1 tab by mouth at bedtime as needed pain  Patient Instructions: 1)  Please schedule a follow-up appointment in 2 months with PMD. 2)  May use Aleve 220mg  by mouth once daily in am and Amitriptyline as needed at night for pain 3)  Take the full week of Valtrex and report any worsening symptoms Prescriptions: AMITRIPTYLINE HCL 10 MG TABS (AMITRIPTYLINE HCL) 1 tab by mouth at bedtime as needed pain  #30 x 2   Entered and Authorized by:   Danise Edge MD   Signed by:   Danise Edge MD on 02/18/2010   Method used:   Electronically to        [facility]* (retail)       [address]       [place], [place]  [address]       Ph: [phone]       Fax: [phone]   RxID:   [id number] VALTREX 500 MG TABS (VALACYCLOVIR HCL) 1 tab by mouth three times a day x  7days  #21 x 0   Entered and Authorized by:   Danise Edge MD   Signed by:   Danise Edge MD on 02/18/2010   Method used:   Electronically to        [facility]* (retail)       [address]       [place], [place]  [address]       Ph: [phone]       Fax: [phone]   RxID:   [id number]

## 2010-08-06 NOTE — Assessment & Plan Note (Signed)
Summary: PT WILL COME IN FASTING/[facility] pt rsc/njr----PT [facility] // RS   Vital Signs:  Patient profile:   75 year old female Height:      61 inches Weight:      156 pounds BMI:     29.58 O2 Sat:      97 % on Room air Temp:     98.2 degrees F oral Pulse rate:   74 / minute BP sitting:   118 / 70  (left arm) Cuff size:   regular  Vitals Entered By: Duard Brady LPN (July 15, 2010 8:47 AM)  O2 Flow:  Room air CC: cpx - doing well Is Patient Diabetic? No   Primary Care Provider:  Gordy Savers  MD  CC:  cpx - doing well.  History of Present Illness: 75 -year-old patient, who is seen today for a health maintenance examination.  She is followed closely by oncology.  More recently she has been seen by dermatology.  She has a history of hypertension, pedal edema, dysesthesia.  Her chief complaint today is postnasal drip associated with cough, but bothers her during the night.  She has not been helped by antihistamines or nasal steroids or they have not been well tolerated.  Here for Medicare AWV:  1.   Risk factors based on Past M, S, F history:vascular risk factors include hypertension 2.   Physical Activities: she remains active without limitations 3.   Depression/mood: no history of mood disorder or depression 4.   Hearing: no hearing deficits 5.   ADL's: independent in all aspects of daily living 6.   Fall Risk: low 7.   Home Safety: no problems identified 8.   Height, weight, &visual acuity:height and weight stable.  No difficulty with visual acuity 9.   Counseling: heart healthy diet and regular.  Exercise encouraged 10.   Labs ordered based on risk factors: laboratory profile, including lipid panel will be reviewed 11.           Referral Coordination- follow-up OB/GYN 12.           Care Plan- heart healthy diet, calcium and vitamin D supplement, regular exercise.  All encouraged 13.            Cognitive Assessment- alert and oriented, with normal  affect   Allergies: 1)  ! * Singular 2)  ! * Zyrtec 3)  ! * Fluticason Propionate 4)  Cipro (Ciprofloxacin) 5)  Naprosyn (Naproxen)  Past History:  Past Medical History: Reviewed history from 03/07/2009 and no changes required. Dysesthesias Menopausal syndrome Palpitations, hx history of shingles gravida two, para two, abortus zero pedal edema Headache Hypertension chronic cough  Past Surgical History: Reviewed history from 04/04/2007 and no changes required. Tonsillectomy colonoscopy 2005  Family History: Reviewed history from 04/03/2009 and no changes required. mother deceased at 58 father deceased at  48 after 20-year history of coronary artery disease.  also had emphysema.  One brother has hypertension One sister-  two maternal aunts had breast cancer  Social History: Reviewed history from 04/03/2009 and no changes required. Married two adult children Patient never smoked.  pt is a retired Runner, broadcasting/film/video.    Review of Systems  The patient denies anorexia, fever, weight loss, weight gain, vision loss, decreased hearing, hoarseness, chest pain, syncope, dyspnea on exertion, peripheral edema, prolonged cough, headaches, hemoptysis, abdominal pain, melena, hematochezia, severe indigestion/heartburn, hematuria, incontinence, genital sores, muscle weakness, suspicious skin lesions, transient blindness, difficulty walking, depression, unusual weight change, abnormal bleeding, enlarged lymph  nodes, angioedema, and breast masses.    Physical Exam  General:  Well-developed,well-nourished,in no acute distress; alert,appropriate and cooperative throughout examination Head:  Normocephalic and atraumatic without obvious abnormalities. No apparent alopecia or balding. Eyes:  No corneal or conjunctival inflammation noted. EOMI. Perrla. Funduscopic exam benign, without hemorrhages, exudates or papilledema. Vision grossly normal. Ears:  External ear exam shows no significant lesions  or deformities.  Otoscopic examination reveals clear canals, tympanic membranes are intact bilaterally without bulging, retraction, inflammation or discharge. Hearing is grossly normal bilaterally. Nose:  External nasal examination shows no deformity or inflammation. Nasal mucosa are pink and moist without lesions or exudates. Mouth:  Oral mucosa and oropharynx without lesions or exudates.  Teeth in good repair. Neck:  No deformities, masses, or tenderness noted. Chest Wall:  No deformities, masses, or tenderness noted. Breasts:  No mass, nodules, thickening, tenderness, bulging, retraction, inflamation, nipple discharge or skin changes noted.   Lungs:  Normal respiratory effort, chest expands symmetrically. Lungs are clear to auscultation, no crackles or wheezes. Heart:  Normal rate and regular rhythm. S1 and S2 normal without gallop, murmur, click, rub or other extra sounds. Abdomen:  Bowel sounds positive,abdomen soft and non-tender without masses, organomegaly or hernias noted. Msk:  No deformity or scoliosis noted of thoracic or lumbar spine.   Pulses:  R and L carotid,radial,femoral,dorsalis pedis and posterior tibial pulses are full and equal bilaterally Extremities:  No clubbing, cyanosis, edema, or deformity noted with normal full range of motion of all joints.   Neurologic:  No cranial nerve deficits noted. Station and gait are normal. Plantar reflexes are down-going bilaterally. DTRs are symmetrical throughout. Sensory, motor and coordinative functions appear intact. Skin:  Intact without suspicious lesions or rashes Cervical Nodes:  No lymphadenopathy noted Axillary Nodes:  No palpable lymphadenopathy Inguinal Nodes:  No significant adenopathy Psych:  Cognition and judgment appear intact. Alert and cooperative with normal attention span and concentration. No apparent delusions, illusions, hallucinations   Impression & Recommendations:  Problem # 1:  Preventive Health Care  (ICD-V70.0)  Orders: Medicare -1st Annual Wellness Visit [phone]) Venipuncture (98119) Specimen Handling (14782) TLB-Lipid Panel (80061-LIPID) TLB-BMP (Basic Metabolic Panel-BMET) (80048-METABOL) TLB-TSH (Thyroid Stimulating Hormone) (84443-TSH) TLB-Hepatic/Liver Function Pnl (80076-HEPATIC) TLB-CBC Platelet - w/Differential (85025-CBCD)  Complete Medication List: 1)  Aspirin Ec 81 Mg Tbec (Aspirin) .... Take 1 tablet by mouth once a day 2)  Prometrium 200 Mg Caps (Progesterone micronized) .... Take days 1-12 every other month 3)  Vivelle 0.05 Mg/24hr Pttw (Estradiol) .... Apply 2x week 4)  Premarin 0.625 Mg/gm Crea (Estrogens, conjugated) .... Use 2x week 5)  Aldactone 25 Mg Tabs (Spironolactone) .... One daily 6)  Furosemide 20 Mg Tabs (Furosemide) .... Take 2 tab by mouth every day 7)  Vitamin D 1000 Unit Tabs (Cholecalciferol) .... Qd 8)  Caltrate 600 1500 Mg Tabs (Calcium carbonate) 9)  Astelin 137 Mcg/spray Soln (Azelastine hcl) .... Use  twice daily  Other Orders: EKG w/ Interpretation (93000)  Patient Instructions: 1)  Please schedule a follow-up appointment in 6 months. 2)  Limit your Sodium (Salt). 3)  It is important that you exercise regularly at least 20 minutes 5 times a week. If you develop chest pain, have severe difficulty breathing, or feel very tired , stop exercising immediately and seek medical attention. 4)  Take calcium +Vitamin D daily. Prescriptions: ASTELIN 137 MCG/SPRAY SOLN (AZELASTINE HCL) use  twice daily  #1 x 6   Entered and Authorized by:   Gordy Savers  MD   Signed by:   Gordy Savers  MD on 07/15/2010   Method used:   Electronically to        [facility]* (retail)       [address]       [place], [place]  [address]       Ph: [phone]       Fax: [phone]   RxID:   [id number] FUROSEMIDE 20 MG TABS (FUROSEMIDE) Take 2 tab by mouth every day  #180 x 4   Entered and Authorized by:   Gordy Savers   MD   Signed by:   Gordy Savers  MD on 07/15/2010   Method used:   Electronically to        [facility]* (retail)       [address]       [place], [place]  [address]       Ph: [phone]       Fax: [phone]   RxID:   [id number] ALDACTONE 25 MG TABS (SPIRONOLACTONE) One daily  #90 x 4   Entered and Authorized by:   Gordy Savers  MD   Signed by:   Gordy Savers  MD on 07/15/2010   Method used:   Electronically to        [facility]* (retail)       [address]       [place], [place]  [address]       Ph: [phone]       Fax: [phone]   RxID:   [id number]    Orders Added: 1)  EKG w/ Interpretation [93000] 2)  Medicare -1st Annual Wellness Visit [G0438] 3)  Est. Patient Level III [62694] 4)  Venipuncture [85462] 5)  Specimen Handling [99000] 6)  TLB-Lipid Panel [80061-LIPID] 7)  TLB-BMP (Basic Metabolic Panel-BMET) [80048-METABOL] 8)  TLB-TSH (Thyroid Stimulating Hormone) [84443-TSH] 9)  TLB-Hepatic/Liver Function Pnl [80076-HEPATIC] 10)  TLB-CBC Platelet - w/Differential [85025-CBCD]

## 2010-08-06 NOTE — Progress Notes (Signed)
  Phone Note Call from Patient   Caller: Patient Call For: Suzanne Savers  MD Summary of Call: Needs referral to an ENT MD for post nasal drainage, please. [phone] Initial call taken by: [facility] CMA AAMA,  June 22, 2010 3:32 PM    New/Updated Medications: FLUCONAZOLE 40 MG/ML SUSR (FLUCONAZOLE) each nare twice daily Prescriptions: FLUCONAZOLE 40 MG/ML SUSR (FLUCONAZOLE) each nare twice daily  #1 bottle x 1   Entered by:   Lynann Beaver CMA AAMA   Authorized by:   Suzanne Savers  MD   Signed by:   Lynann Beaver CMA AAMA on 06/23/2010   Method used:   Electronically to        [facility]* (retail)       [address]       [place], [place]  [address]       Ph: [phone]       Fax: [phone]   RxID:   [id number]  This is not a surgical/ENT issue;  please call in a prescription for a fluticasone nasal spray  ;  use each nares  Twice daily  Appended Document:  ERROR  Fluticasone Nasal Spray.  Previous prescription is an ERROR .  Corrected!

## 2010-09-21 ENCOUNTER — Ambulatory Visit (INDEPENDENT_AMBULATORY_CARE_PROVIDER_SITE_OTHER): Payer: Medicare Other | Admitting: Internal Medicine

## 2010-09-21 ENCOUNTER — Encounter: Payer: Self-pay | Admitting: Internal Medicine

## 2010-09-21 VITALS — BP 142/80 | Temp 98.4°F | Wt 159.0 lb

## 2010-09-21 DIAGNOSIS — I10 Essential (primary) hypertension: Secondary | ICD-10-CM

## 2010-09-21 MED ORDER — TELMISARTAN 40 MG PO TABS: 40.0000 mg | ORAL_TABLET | Freq: Every day | ORAL | Status: DC

## 2010-09-21 NOTE — Progress Notes (Signed)
  Subjective:    Patient ID: Suzanne Russell, female    DOB: 29-Nov-1935, 75 y.o.   MRN: [id number]  HPI   75 year old patient who is seen today for followup of her hypertension. The patient has been on my card as in the past the presently is on diuretic therapy only. Blood pressure readings have been consistently elevated with home blood pressure monitoring. Blood pressure systolic readings have been quite elevated. She feels well except for some eye complaints for which she is being followed by ophthalmology.    Review of Systems  Constitutional: Negative.   HENT: Negative for hearing loss, congestion, sore throat, rhinorrhea, dental problem, sinus pressure and tinnitus.   Eyes: Negative for pain, discharge and visual disturbance.  Respiratory: Negative for cough and shortness of breath.   Cardiovascular: Negative for chest pain, palpitations and leg swelling.  Gastrointestinal: Negative for nausea, vomiting, abdominal pain, diarrhea, constipation, blood in stool and abdominal distention.  Genitourinary: Negative for dysuria, urgency, frequency, hematuria, flank pain, vaginal bleeding, vaginal discharge, difficulty urinating, vaginal pain and pelvic pain.  Musculoskeletal: Negative for joint swelling, arthralgias and gait problem.  Skin: Negative for rash.  Neurological: Negative for dizziness, syncope, speech difficulty, weakness, numbness and headaches.  Hematological: Negative for adenopathy.  Psychiatric/Behavioral: Negative for behavioral problems, dysphoric mood and agitation. The patient is not nervous/anxious.        Objective:   Physical Exam  Constitutional: She appears well-nourished. No distress.        Multiple blood pressure readings were taken in both arms systolic readings were consistently in the  170-180 range with normal diastolic readings. There is good correlation with the home blood pressure monitor          Assessment & Plan:   systolic hypertension. The  patient is present on diuretic therapy and has had a difficult time with pedal edema. She was well controlled on my card as in the past. This will be resumed. Samples and a prescription dispensed

## 2010-09-21 NOTE — Patient Instructions (Signed)
Return office visit in 2 weeks Please check your blood pressure on a regular basis.  If it is consistently greater than 150/90, please make an office appointment.  Limit your sodium (Salt) intake

## 2010-09-24 LAB — POCT HEMOGLOBIN-HEMACUE: Hemoglobin: 12.1 g/dL (ref 12.0–15.0)

## 2010-09-24 LAB — BASIC METABOLIC PANEL
CO2: 28 mEq/L (ref 19–32)
Chloride: 102 mEq/L (ref 96–112)
Creatinine, Ser: 0.82 mg/dL (ref 0.4–1.2)
GFR calc Af Amer: >60 mL/min (ref >60)

## 2010-10-06 ENCOUNTER — Ambulatory Visit (INDEPENDENT_AMBULATORY_CARE_PROVIDER_SITE_OTHER): Payer: Medicare Other | Admitting: Internal Medicine

## 2010-10-06 ENCOUNTER — Encounter: Payer: Self-pay | Admitting: Internal Medicine

## 2010-10-06 DIAGNOSIS — I10 Essential (primary) hypertension: Secondary | ICD-10-CM

## 2010-10-06 DIAGNOSIS — R609 Edema, unspecified: Secondary | ICD-10-CM

## 2010-10-06 MED ORDER — FUROSEMIDE 20 MG PO TABS: 20.0000 mg | ORAL_TABLET | Freq: Every day | ORAL | Status: DC

## 2010-10-06 MED ORDER — GABAPENTIN 300 MG PO CAPS: 300.0000 mg | ORAL_CAPSULE | Freq: Three times a day (TID) | ORAL | Status: DC

## 2010-10-06 NOTE — Progress Notes (Signed)
  Subjective:    Patient ID: Suzanne Russell, female    DOB: 1935-07-08, 75 y.o.   MRN: [id number]  HPI  75 year old patient who is in today for followup of her hypertension she had been on micardis and Aldactone was added to her regimen.  This was a substitute for hydrochlorothiazide since she was having some paresthesias. 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 She has done well on Neurontin.  She was seen today for electrolytes. However due to concerns about her potassium she has not taken Aldactone for 2 weeks. She does take furosemide almost daily however and her blood pressure has been fairly normal. She feels well today and has had a nice response with Neurontin Review of Systems  Constitutional: Negative.   HENT: Negative for hearing loss, congestion, sore throat, rhinorrhea, dental problem, sinus pressure and tinnitus.   Eyes: Negative for pain, discharge and visual disturbance.  Respiratory: Negative for cough and shortness of breath.   Cardiovascular: Negative for chest pain, palpitations and leg swelling.  Gastrointestinal: Negative for nausea, vomiting, abdominal pain, diarrhea, constipation, blood in stool and abdominal distention.  Genitourinary: Negative for dysuria, urgency, frequency, hematuria, flank pain, vaginal bleeding, vaginal discharge, difficulty urinating, vaginal pain and pelvic pain.  Musculoskeletal: Negative for joint swelling, arthralgias and gait problem.  Skin: Negative for rash.  Neurological: Negative for dizziness, syncope, speech difficulty, weakness, numbness and headaches.  Hematological: Negative for adenopathy.  Psychiatric/Behavioral: Negative for behavioral problems, dysphoric mood and agitation. The patient is not nervous/anxious.        Objective:   Physical Exam  Constitutional: She is oriented to person, place, and time. She appears well-developed and well-nourished.  HENT:  Head: Normocephalic.  Right  Ear: External ear normal.  Left Ear: External ear normal.  Mouth/Throat: Oropharynx is clear and moist.  Eyes: Conjunctivae and EOM are normal. Pupils are equal, round, and reactive to light.  Neck: Normal range of motion. Neck supple. No thyromegaly present.  Cardiovascular: Normal rate, regular rhythm, normal heart sounds and intact distal pulses.   Pulmonary/Chest: Effort normal and breath sounds normal.  Abdominal: Soft. Bowel sounds are normal. She exhibits no mass. There is no tenderness.  Musculoskeletal: Normal range of motion.  Lymphadenopathy:    She has no cervical adenopathy.  Neurological: She is alert and oriented to person, place, and time.  Skin: Skin is warm and dry. No rash noted.  Psychiatric: She has a normal mood and affect. Her behavior is normal.          Assessment & Plan:

## 2010-10-06 NOTE — Patient Instructions (Signed)
Limit your sodium (Salt) intake  Please check your blood pressure on a regular basis.  If it is consistently greater than 150/90, please make an office appointment.  Return in 3 months for follow-up  

## 2010-10-18 ENCOUNTER — Other Ambulatory Visit: Payer: Self-pay | Admitting: Family Medicine

## 2010-11-20 NOTE — Assessment & Plan Note (Signed)
[facility]                              [facility] OFFICE NOTE   NAME:Suzanne Russell, Suzanne Russell                     MRN:          [id number]  DATE:02/22/2006                            DOB:          02-02-1936   The patient is a 75 year old white female seen today for an annual exam.  She is followed by Dr. Nicholas Lose and earlier this year has had an EKG and  gynecologic exam.  She has seen neurology for some dysesthesias, which seem  to be quite stable and well-controlled on an h.s. dose of nortriptyline.  She has no concerns or complaints today.  She has menopausal syndrome on  transdermal hormone patches as well as once-weekly transvaginal estrogen.  She is a gravida 2, para 2, abortus 0.  She has had a remote tonsillectomy.   ALLERGIES:  NAPROXEN that caused tongue swelling.   REVIEW OF SYSTEMS:  Exam is negative.  She has had a colonoscopy in 2005, a  prior bone density study.  Does get annual mammograms.   FAMILY HISTORY:  Father died at 49, had an MI at 57, a strong tobacco user.  Mother is 66 and doing remarkably well.  One brother, one sister, possible  hypertension.  Two aunts have had breast cancer.   PHYSICAL EXAMINATION:  GENERAL:  A well-developed and healthy-appearing lady  in no acute distress.  VITAL SIGNS:  Blood pressure is low normal.  SKIN:  Negative.  HEENT:  Fundi, ear nose and throat clear.  NECK:  No bruits or adenopathy, no thyroid enlargement.  CHEST:  Clear.  BREASTS:  Negative.  CARDIOVASCULAR:  Normal heart sounds, no murmurs.  ABDOMEN:  Benign, no organomegaly.  PELVIC, PAP:  Deferred.  EXTREMITIES:  Full peripheral pulses, no edema.  NEUROLOGIC:  Negative.   IMPRESSION:  1. Menopausal syndrome.  2. History of dysesthesias.  3. History of palpitations, asymptomatic.   DISPOSITION:  Laboratory update will be reviewed.  She will return in 1 year  for follow-up.  Medical regimen unchanged.         Gordy Savers, MD  [facility]  DD:  02/22/2006 DT:  02/22/2006 Job #:  [id number]

## 2010-11-23 ENCOUNTER — Ambulatory Visit (INDEPENDENT_AMBULATORY_CARE_PROVIDER_SITE_OTHER): Payer: Medicare Other | Admitting: Internal Medicine

## 2010-11-23 ENCOUNTER — Encounter: Payer: Self-pay | Admitting: Internal Medicine

## 2010-11-23 DIAGNOSIS — I10 Essential (primary) hypertension: Secondary | ICD-10-CM

## 2010-11-23 DIAGNOSIS — R209 Unspecified disturbances of skin sensation: Secondary | ICD-10-CM

## 2010-11-23 DIAGNOSIS — R609 Edema, unspecified: Secondary | ICD-10-CM

## 2010-11-23 MED ORDER — FUROSEMIDE 40 MG PO TABS: 40.0000 mg | ORAL_TABLET | Freq: Every day | ORAL | Status: DC

## 2010-11-23 MED ORDER — GABAPENTIN 400 MG PO CAPS: ORAL_CAPSULE | ORAL | Status: DC

## 2010-11-23 NOTE — Patient Instructions (Signed)
Limit your sodium (Salt) intake  Keep legs elevated as much as possible  Return in 6 months for follow-up

## 2010-11-23 NOTE — Progress Notes (Signed)
  Subjective:    Patient ID: Suzanne Russell, female    DOB: 1935/09/07, 75 y.o.   MRN: [id number]  HPI  and 75 year old patient who is seen today with a chief complaint of increasing lower extremity edema over the past couple weeks. She does have a history of chronic pedal edema and is on low dose of furosemide. Her chief complaint however is her dysesthesia. She is followed by neurology and is on Neurontin 300 mg every 4 hours. She states his medication last around 3 hours before it loses effectiveness. She denies any cardiopulmonary complaints. She does have treated hypertension    Review of Systems  Constitutional: Negative.   HENT: Negative for hearing loss, congestion, sore throat, rhinorrhea, dental problem, sinus pressure and tinnitus.   Eyes: Negative for pain, discharge and visual disturbance.  Respiratory: Negative for cough and shortness of breath.   Cardiovascular: Positive for leg swelling. Negative for chest pain and palpitations.  Gastrointestinal: Negative for nausea, vomiting, abdominal pain, diarrhea, constipation, blood in stool and abdominal distention.  Genitourinary: Negative for dysuria, urgency, frequency, hematuria, flank pain, vaginal bleeding, vaginal discharge, difficulty urinating, vaginal pain and pelvic pain.  Musculoskeletal: Negative for joint swelling, arthralgias and gait problem.  Skin: Negative for rash.  Neurological: Negative for dizziness, syncope, speech difficulty, weakness, numbness and headaches.  Hematological: Negative for adenopathy.  Psychiatric/Behavioral: Negative for behavioral problems, dysphoric mood and agitation. The patient is not nervous/anxious.        Objective:   Physical Exam  Constitutional: She is oriented to person, place, and time. She appears well-developed and well-nourished.       Blood pressure 120/80  HENT:  Head: Normocephalic.  Right Ear: External ear normal.  Left Ear: External ear normal.  Mouth/Throat:  Oropharynx is clear and moist.  Eyes: Conjunctivae and EOM are normal. Pupils are equal, round, and reactive to light.  Neck: Normal range of motion. Neck supple. No thyromegaly present.  Cardiovascular: Normal rate, regular rhythm, normal heart sounds and intact distal pulses.   Pulmonary/Chest: Effort normal and breath sounds normal.  Abdominal: Soft. Bowel sounds are normal. She exhibits no mass. There is no tenderness.  Genitourinary:       +2 edema distal to the knees  Musculoskeletal: Normal range of motion. She exhibits edema.  Lymphadenopathy:    She has no cervical adenopathy.  Neurological: She is alert and oriented to person, place, and time.  Skin: Skin is warm and dry. No rash noted.  Psychiatric: She has a normal mood and affect. Her behavior is normal.          Assessment & Plan:   Lower extremity edema. We'll increase her furosemide to 40 mg daily. She'll attempt to elevate and restrict salt Hypertension stable we'll continue Micardis Neuropathy. We'll increase Neurontin to 400 mg every 4 hours when necessary. She will followup with neurology

## 2011-01-05 ENCOUNTER — Ambulatory Visit: Payer: BC Managed Care – PPO | Admitting: Internal Medicine

## 2011-01-26 ENCOUNTER — Encounter: Payer: Self-pay | Admitting: Internal Medicine

## 2011-01-26 ENCOUNTER — Ambulatory Visit (INDEPENDENT_AMBULATORY_CARE_PROVIDER_SITE_OTHER): Payer: Medicare Other | Admitting: Internal Medicine

## 2011-01-26 DIAGNOSIS — I10 Essential (primary) hypertension: Secondary | ICD-10-CM

## 2011-01-26 DIAGNOSIS — R209 Unspecified disturbances of skin sensation: Secondary | ICD-10-CM

## 2011-01-26 DIAGNOSIS — R609 Edema, unspecified: Secondary | ICD-10-CM

## 2011-01-26 MED ORDER — GABAPENTIN 800 MG PO TABS: ORAL_TABLET | ORAL | Status: DC

## 2011-01-26 NOTE — Patient Instructions (Signed)
Limit your sodium (Salt) intake    It is important that you exercise regularly, at least 20 minutes 3 to 4 times per week.  If you develop chest pain or shortness of breath seek  medical attention.  Return in 6 months for follow-up  

## 2011-01-26 NOTE — Progress Notes (Signed)
  Subjective:    Patient ID: Suzanne Russell, female    DOB: [birth year], 75 y.o.   MRN: [id number]  HPI  75 year old patient who is seen today for followup. She has history of small nerve fiber peripheral neuropathy and is followed closely by neurology they have suggested a dose titration on her Neurontin. She has chronic lower extremity edema. She has treated hypertension in general she is doing quite well. She remains quite symptomatic with her painful dysesthesia. She is followed by OB and is on hormone replacement therapy.     Review of Systems  Constitutional: Negative.   HENT: Negative for hearing loss, congestion, sore throat, rhinorrhea, dental problem, sinus pressure and tinnitus.   Eyes: Negative for pain, discharge and visual disturbance.  Respiratory: Negative for cough and shortness of breath.   Cardiovascular: Negative for chest pain, palpitations and leg swelling.  Gastrointestinal: Negative for nausea, vomiting, abdominal pain, diarrhea, constipation, blood in stool and abdominal distention.  Genitourinary: Negative for dysuria, urgency, frequency, hematuria, flank pain, vaginal bleeding, vaginal discharge, difficulty urinating, vaginal pain and pelvic pain.  Musculoskeletal: Negative for joint swelling, arthralgias and gait problem.  Skin: Negative for rash.  Neurological: Negative for dizziness, syncope, speech difficulty, weakness, numbness and headaches.  Hematological: Negative for adenopathy.  Psychiatric/Behavioral: Negative for behavioral problems, dysphoric mood and agitation. The patient is not nervous/anxious.        Objective:   Physical Exam  Constitutional: She is oriented to person, place, and time. She appears well-developed and well-nourished.  HENT:  Head: Normocephalic.  Right Ear: External ear normal.  Left Ear: External ear normal.  Mouth/Throat: Oropharynx is clear and moist.  Eyes: Conjunctivae and EOM are normal. Pupils are equal, round, and  reactive to light.  Neck: Normal range of motion. Neck supple. No thyromegaly present.  Cardiovascular: Normal rate, regular rhythm, normal heart sounds and intact distal pulses.   Pulmonary/Chest: Effort normal and breath sounds normal.  Abdominal: Soft. Bowel sounds are normal. She exhibits no mass. There is no tenderness.  Musculoskeletal: Normal range of motion. She exhibits edema.       +2 edema  Lymphadenopathy:    She has no cervical adenopathy.  Neurological: She is alert and oriented to person, place, and time.  Skin: Skin is warm and dry. No rash noted.  Psychiatric: She has a normal mood and affect. Her behavior is normal.          Assessment & Plan:  Hypertension well controlled Peripheral edema unchanged Peripheral neuropathy. We'll up titrate her Neurontin per neurology recommendation  Recheck here in 6 months or when necessary

## 2011-02-04 ENCOUNTER — Telehealth: Payer: Self-pay | Admitting: *Deleted

## 2011-02-04 NOTE — Telephone Encounter (Signed)
Pt wanted to make sure Dr. Kirtland Bouchard knows that she had been on 60 mg of Lasix daily, and if he meant to lower it to 40 mg.  Her swelling is worse and would like to go back to 60 mg.

## 2011-02-04 NOTE — Telephone Encounter (Signed)
Continue Lasix 60 mg

## 2011-02-05 MED ORDER — FUROSEMIDE 40 MG PO TABS: 40.0000 mg | ORAL_TABLET | Freq: Every day | ORAL | Status: DC

## 2011-02-05 NOTE — Telephone Encounter (Signed)
Notified pt. 

## 2011-04-19 ENCOUNTER — Ambulatory Visit (INDEPENDENT_AMBULATORY_CARE_PROVIDER_SITE_OTHER): Payer: Medicare Other | Admitting: Internal Medicine

## 2011-04-19 ENCOUNTER — Encounter: Payer: Self-pay | Admitting: Internal Medicine

## 2011-04-19 VITALS — BP 122/80 | Temp 97.9°F | Wt 167.0 lb

## 2011-04-19 DIAGNOSIS — I10 Essential (primary) hypertension: Secondary | ICD-10-CM

## 2011-04-19 DIAGNOSIS — R209 Unspecified disturbances of skin sensation: Secondary | ICD-10-CM

## 2011-04-19 DIAGNOSIS — Z0000 Encounter for general adult medical examination without abnormal findings: Secondary | ICD-10-CM

## 2011-04-19 DIAGNOSIS — R609 Edema, unspecified: Secondary | ICD-10-CM

## 2011-04-19 DIAGNOSIS — Z23 Encounter for immunization: Secondary | ICD-10-CM

## 2011-04-19 MED ORDER — DULOXETINE HCL 30 MG PO CPEP: 30.0000 mg | ORAL_CAPSULE | Freq: Every day | ORAL | Status: DC

## 2011-04-19 MED ORDER — HYDROCODONE-ACETAMINOPHEN 5-500 MG PO TABS: 1.0000 | ORAL_TABLET | ORAL | Status: AC | PRN

## 2011-04-19 MED ORDER — TRAMADOL HCL 50 MG PO TABS: 50.0000 mg | ORAL_TABLET | Freq: Four times a day (QID) | ORAL | Status: DC | PRN

## 2011-04-19 NOTE — Patient Instructions (Signed)
Limit your sodium (Salt) intake  Return office visit 6 weeks  Please check your blood pressure on a regular basis.  If it is consistently greater than 150/90, please make an office appointment.

## 2011-04-19 NOTE — Progress Notes (Signed)
  Subjective:    Patient ID: Suzanne Russell, female    DOB: [birth year], 75 y.o.   MRN: [id number]  HPI  75 year old patient who is seen today for followup. She has been evaluated by neurology due to neuropathic pain. She presently is on Neurontin which has been quite helpful. She has tried Cymbalta in the past that was also quite helpful but she no longer takes. There was some concern about diarrhea. 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 She states the pain at times is quite severe. She has treated hypertension which has been stable. She has chronic pedal edema which has been controlled with furosemide    Review of Systems  Constitutional: Negative.   HENT: Negative for hearing loss, congestion, sore throat, rhinorrhea, dental problem, sinus pressure and tinnitus.   Eyes: Negative for pain, discharge and visual disturbance.  Respiratory: Negative for cough and shortness of breath.   Cardiovascular: Negative for chest pain, palpitations and leg swelling.  Gastrointestinal: Negative for nausea, vomiting, abdominal pain, diarrhea, constipation, blood in stool and abdominal distention.  Genitourinary: Negative for dysuria, urgency, frequency, hematuria, flank pain, vaginal bleeding, vaginal discharge, difficulty urinating, vaginal pain and pelvic pain.  Musculoskeletal: Negative for joint swelling, arthralgias and gait problem.  Skin: Negative for rash.  Neurological: Negative for dizziness, syncope, speech difficulty, weakness, numbness and headaches.       Painful dysesthesias  Hematological: Negative for adenopathy.  Psychiatric/Behavioral: Negative for behavioral problems, dysphoric mood and agitation. The patient is not nervous/anxious.        Objective:   Physical Exam  Constitutional: She is oriented to person, place, and time. She appears well-developed and well-nourished.  HENT:  Head: Normocephalic.  Right Ear: External ear normal.  Left Ear:  External ear normal.  Mouth/Throat: Oropharynx is clear and moist.  Eyes: Conjunctivae and EOM are normal. Pupils are equal, round, and reactive to light.  Neck: Normal range of motion. Neck supple. No thyromegaly present.  Cardiovascular: Normal rate, regular rhythm, normal heart sounds and intact distal pulses.   Pulmonary/Chest: Effort normal and breath sounds normal.  Abdominal: Soft. Bowel sounds are normal. She exhibits no mass. There is no tenderness.  Musculoskeletal: Normal range of motion.  Lymphadenopathy:    She has no cervical adenopathy.  Neurological: She is alert and oriented to person, place, and time.  Skin: Skin is warm and dry. No rash noted.  Psychiatric: She has a normal mood and affect. Her behavior is normal.          Assessment & Plan:   Neuropathic pain. We'll continue gabapentin which has been helpful. We'll add Cymbalta 30 mg in the morning samples were provided. She is also given new prescriptions for tramadol to take when necessary pain if this is not effective a prescription for hydrocodone also dispensed Hypertension well controlled today Pedal edema stable  We'll recheck in 6 weeks

## 2011-05-26 ENCOUNTER — Ambulatory Visit: Payer: BC Managed Care – PPO | Admitting: Internal Medicine

## 2011-05-31 ENCOUNTER — Encounter: Payer: Self-pay | Admitting: Internal Medicine

## 2011-05-31 ENCOUNTER — Ambulatory Visit (INDEPENDENT_AMBULATORY_CARE_PROVIDER_SITE_OTHER): Payer: Medicare Other | Admitting: Internal Medicine

## 2011-05-31 DIAGNOSIS — R209 Unspecified disturbances of skin sensation: Secondary | ICD-10-CM

## 2011-05-31 DIAGNOSIS — I10 Essential (primary) hypertension: Secondary | ICD-10-CM

## 2011-05-31 NOTE — Progress Notes (Signed)
  Subjective:    Patient ID: Suzanne Russell, female    DOB: 1935/11/09, 75 y.o.   MRN: [id number]  HPI  75 year old patient who is seen today for followup of her neuropathic pain. She was resumed on Cymbalta 30 mg every morning one month ago she remains on Neurontin and tramadol 4 times daily and has done quite well on this regimen. Her neuropathic pain is essentially nonexistent on this regimen. She has treated hypertension which has been controlled on Micardis 40 mg daily. This has been well-controlled No complaints of diarrhea today    Review of Systems  Constitutional: Negative.   HENT: Negative for hearing loss, congestion, sore throat, rhinorrhea, dental problem, sinus pressure and tinnitus.   Eyes: Negative for pain, discharge and visual disturbance.  Respiratory: Negative for cough and shortness of breath.   Cardiovascular: Negative for chest pain, palpitations and leg swelling.  Gastrointestinal: Negative for nausea, vomiting, abdominal pain, diarrhea, constipation, blood in stool and abdominal distention.  Genitourinary: Negative for dysuria, urgency, frequency, hematuria, flank pain, vaginal bleeding, vaginal discharge, difficulty urinating, vaginal pain and pelvic pain.  Musculoskeletal: Negative for joint swelling, arthralgias and gait problem.  Skin: Negative for rash.  Neurological: Negative for dizziness, syncope, speech difficulty, weakness, numbness and headaches.  Hematological: Negative for adenopathy.  Psychiatric/Behavioral: Negative for behavioral problems, dysphoric mood and agitation. The patient is not nervous/anxious.        Objective:   Physical Exam  Constitutional: She is oriented to person, place, and time. She appears well-developed and well-nourished.  HENT:  Head: Normocephalic.  Right Ear: External ear normal.  Left Ear: External ear normal.  Neck: No thyromegaly present.  Cardiovascular: Normal rate and regular rhythm.   Pulmonary/Chest: Effort  normal. No respiratory distress.  Abdominal: She exhibits no mass. There is no tenderness.  Musculoskeletal: Normal range of motion.  Lymphadenopathy:    She has no cervical adenopathy.  Neurological: She is alert and oriented to person, place, and time.  Skin: Skin is warm and dry. No rash noted.  Psychiatric: She has a normal mood and affect. Her behavior is normal.          Assessment & Plan:   Neuropathic pain. Controlled. We'll continue present regimen Hypertension well controlled.  Low salt diet recommended. She does have a history of peripheral edema. We'll recheck in 4 months or as needed

## 2011-05-31 NOTE — Patient Instructions (Signed)
Limit your sodium (Salt) intake    It is important that you exercise regularly, at least 20 minutes 3 to 4 times per week.  If you develop chest pain or shortness of breath seek  medical attention.  Return in 4 months for follow-up   

## 2011-07-15 ENCOUNTER — Other Ambulatory Visit: Payer: Self-pay

## 2011-07-15 MED ORDER — OMEPRAZOLE 40 MG PO CPDR: 40.0000 mg | DELAYED_RELEASE_CAPSULE | Freq: Every day | ORAL | Status: DC

## 2011-07-26 ENCOUNTER — Ambulatory Visit: Payer: BC Managed Care – PPO | Admitting: Internal Medicine

## 2011-08-08 ENCOUNTER — Other Ambulatory Visit: Payer: Self-pay | Admitting: Internal Medicine

## 2011-09-27 ENCOUNTER — Telehealth: Payer: Self-pay

## 2011-09-27 ENCOUNTER — Ambulatory Visit: Payer: BC Managed Care – PPO | Admitting: Internal Medicine

## 2011-09-27 NOTE — Telephone Encounter (Signed)
Pt states she called the [facility] line to cancel her appt due to having diarrhea all night.  Pt denies any fever or any other symptoms.  Pt states she is trying to stay well hydrated but does not want to come into the office.  Pt states she was coming in for a medication refill and she feels like the medications are working well.  Pt states she does have a headache but feels this is due to the medicines.  Pls advise.  Pt has rescheduled appt.

## 2011-09-28 ENCOUNTER — Encounter: Payer: Self-pay | Admitting: Internal Medicine

## 2011-09-28 ENCOUNTER — Ambulatory Visit (INDEPENDENT_AMBULATORY_CARE_PROVIDER_SITE_OTHER): Payer: Medicare Other | Admitting: Internal Medicine

## 2011-09-28 VITALS — BP 140/80 | Temp 98.0°F | Wt 159.0 lb

## 2011-09-28 DIAGNOSIS — I10 Essential (primary) hypertension: Secondary | ICD-10-CM

## 2011-09-28 DIAGNOSIS — K591 Functional diarrhea: Secondary | ICD-10-CM

## 2011-09-28 MED ORDER — FUROSEMIDE 40 MG PO TABS: 40.0000 mg | ORAL_TABLET | Freq: Every day | ORAL | Status: DC

## 2011-09-28 MED ORDER — OMEPRAZOLE 40 MG PO CPDR: 40.0000 mg | DELAYED_RELEASE_CAPSULE | Freq: Every day | ORAL | Status: DC

## 2011-09-28 MED ORDER — DULOXETINE HCL 30 MG PO CPEP: 30.0000 mg | ORAL_CAPSULE | Freq: Every day | ORAL | Status: DC

## 2011-09-28 MED ORDER — ESTROGENS, CONJUGATED 0.625 MG/GM VA CREA: TOPICAL_CREAM | VAGINAL | Status: DC

## 2011-09-28 MED ORDER — TELMISARTAN 80 MG PO TABS: 80.0000 mg | ORAL_TABLET | Freq: Every day | ORAL | Status: DC

## 2011-09-28 MED ORDER — TRAMADOL HCL 50 MG PO TABS: 50.0000 mg | ORAL_TABLET | Freq: Four times a day (QID) | ORAL | Status: DC | PRN

## 2011-09-28 MED ORDER — GABAPENTIN 800 MG PO TABS: ORAL_TABLET | ORAL | Status: DC

## 2011-09-28 NOTE — Progress Notes (Signed)
  Subjective:    Patient ID: Suzanne Russell, female    DOB: Nov 28, 1935, 76 y.o.   MRN: [id number]  HPI 76 year old patient who is seen today for followup. She has treated hypertension. She states her systolic blood pressure readings are occasionally as high as 180. In general doing quite well she is on Neurontin for dysesthesia which showed a has been much improved. She has a history of functional diarrhea and gastroesophageal reflux disease. Medical regimen also includes Cymbalta. Medicine refills required.  Wt Readings from Last 3 Encounters:  09/28/11 159 lb (72.122 kg)  05/31/11 165 lb (74.844 kg)  04/19/11 167 lb (75.751 kg)     Review of Systems  Constitutional: Negative.   HENT: Negative for hearing loss, congestion, sore throat, rhinorrhea, dental problem, sinus pressure and tinnitus.   Eyes: Negative for pain, discharge and visual disturbance.  Respiratory: Negative for cough and shortness of breath.   Cardiovascular: Negative for chest pain, palpitations and leg swelling.  Gastrointestinal: Positive for diarrhea. Negative for nausea, vomiting, abdominal pain, constipation, blood in stool and abdominal distention.  Genitourinary: Negative for dysuria, urgency, frequency, hematuria, flank pain, vaginal bleeding, vaginal discharge, difficulty urinating, vaginal pain and pelvic pain.  Musculoskeletal: Negative for joint swelling, arthralgias and gait problem.  Skin: Negative for rash.  Neurological: Negative for dizziness, syncope, speech difficulty, weakness, numbness and headaches.  Hematological: Negative for adenopathy.  Psychiatric/Behavioral: Negative for behavioral problems, dysphoric mood and agitation. The patient is not nervous/anxious.        Objective:   Physical Exam  Constitutional: She is oriented to person, place, and time. She appears well-developed and well-nourished.       Blood pressure 140/70  HENT:  Head: Normocephalic.  Right Ear: External ear normal.    Left Ear: External ear normal.  Mouth/Throat: Oropharynx is clear and moist.  Eyes: Conjunctivae and EOM are normal. Pupils are equal, round, and reactive to light.  Neck: Normal range of motion. Neck supple. No thyromegaly present.  Cardiovascular: Normal rate, regular rhythm, normal heart sounds and intact distal pulses.   Pulmonary/Chest: Effort normal and breath sounds normal.  Abdominal: Soft. Bowel sounds are normal. She exhibits no mass. There is no tenderness.  Musculoskeletal: Normal range of motion.  Lymphadenopathy:    She has no cervical adenopathy.  Neurological: She is alert and oriented to person, place, and time.  Skin: Skin is warm and dry. No rash noted.  Psychiatric: She has a normal mood and affect. Her behavior is normal.          Assessment & Plan:  Hypertension- will increase micardis to 80 mg daily Functional diarrhea Dysesthesia in stable Gastroesophageal reflux disease. Continue omeprazole.   We'll schedule CPX in 4 months

## 2011-09-28 NOTE — Patient Instructions (Signed)
Limit your sodium (Salt) intake  Please check your blood pressure on a regular basis.  If it is consistently greater than 150/90, please make an office appointment.  Take a calcium supplement, plus [phone] units of vitamin D  Return in 4 months for follow-up

## 2011-11-04 ENCOUNTER — Ambulatory Visit (INDEPENDENT_AMBULATORY_CARE_PROVIDER_SITE_OTHER): Payer: Medicare Other | Admitting: Internal Medicine

## 2011-11-04 ENCOUNTER — Encounter: Payer: Self-pay | Admitting: Internal Medicine

## 2011-11-04 VITALS — BP 120/80 | Temp 97.9°F | Wt 163.0 lb

## 2011-11-04 DIAGNOSIS — I10 Essential (primary) hypertension: Secondary | ICD-10-CM

## 2011-11-04 MED ORDER — AMLODIPINE BESYLATE 2.5 MG PO TABS: 2.5000 mg | ORAL_TABLET | Freq: Every day | ORAL | Status: DC

## 2011-11-04 NOTE — Patient Instructions (Signed)
Limit your sodium (Salt) intake  Please check your blood pressure on a regular basis.  Return in 6 weeks for followup

## 2011-11-04 NOTE — Progress Notes (Signed)
  Subjective:    Patient ID: Suzanne Russell, female    DOB: April 17, 1936, 76 y.o.   MRN: [id number]  HPI  76 year old patient who has a history of hypertension she is said that consistently high systolic readings for the past couple of months. She omitted furosemide yesterday and blood pressure was quite high and associated with some headaches. Otherwise she is doing quite well.    Review of Systems  Constitutional: Negative.   HENT: Negative for hearing loss, congestion, sore throat, rhinorrhea, dental problem, sinus pressure and tinnitus.   Eyes: Negative for pain, discharge and visual disturbance.  Respiratory: Negative for cough and shortness of breath.   Cardiovascular: Negative for chest pain, palpitations and leg swelling.  Gastrointestinal: Negative for nausea, vomiting, abdominal pain, diarrhea, constipation, blood in stool and abdominal distention.  Genitourinary: Negative for dysuria, urgency, frequency, hematuria, flank pain, vaginal bleeding, vaginal discharge, difficulty urinating, vaginal pain and pelvic pain.  Musculoskeletal: Negative for joint swelling, arthralgias and gait problem.  Skin: Negative for rash.  Neurological: Positive for headaches. Negative for dizziness, syncope, speech difficulty, weakness and numbness.  Hematological: Negative for adenopathy.  Psychiatric/Behavioral: Negative for behavioral problems, dysphoric mood and agitation. The patient is not nervous/anxious.        Objective:   Physical Exam  Constitutional: She is oriented to person, place, and time. She appears well-developed and well-nourished.       Blood pressure 160/60  HENT:  Head: Normocephalic.  Right Ear: External ear normal.  Left Ear: External ear normal.  Eyes: Conjunctivae and EOM are normal. Pupils are equal, round, and reactive to light.  Neck: Normal range of motion. Neck supple. No thyromegaly present.  Cardiovascular: Normal rate, regular rhythm and normal heart sounds.     Pulmonary/Chest: Effort normal and breath sounds normal.  Abdominal: She exhibits no mass. There is no tenderness.  Lymphadenopathy:    She has no cervical adenopathy.  Neurological: She is alert and oriented to person, place, and time.  Skin: Skin is warm and dry. No rash noted.  Psychiatric: She has a normal mood and affect. Her behavior is normal.          Assessment & Plan:   Hypertension suboptimal control with elevated systolic readings. We'll add amlodipine 2.5 mg daily Recheck 6 weeks

## 2011-11-15 ENCOUNTER — Ambulatory Visit (INDEPENDENT_AMBULATORY_CARE_PROVIDER_SITE_OTHER): Payer: Medicare Other | Admitting: Internal Medicine

## 2011-11-15 ENCOUNTER — Encounter: Payer: Self-pay | Admitting: Internal Medicine

## 2011-11-15 VITALS — BP 120/70 | Wt 158.0 lb

## 2011-11-15 DIAGNOSIS — I10 Essential (primary) hypertension: Secondary | ICD-10-CM

## 2011-11-15 NOTE — Progress Notes (Signed)
  Subjective:    Patient ID: Suzanne Russell, female    DOB: 11-17-1935, 76 y.o.   MRN: [id number]  HPI    76 year old patient who is seen today for followup of her hypertension. 11 days ago she was placed on amlodipine 2.5 mg daily due to elevated systolic readings associated with occasional headaches. Blood pressure is in the morning are still slightly high.  She still has some occasional headaches.    Review of Systems  Neurological: Positive for headaches.       Objective:   Physical Exam  Constitutional:       Blood pressure readings 130 to 134 systolic with diastolic readings 55-60. Good correlation with home blood pressure monitor          Assessment & Plan:    Hypertension. Normal circadian rhythm discussed. Patient probably has not reached the full benefit of the amlodipine due to the long half-life. Options discussed. The patient does have a complete physical scheduled in July we'll continue to monitor blood pressure on her present regimen but we'll consider dose titration if needed

## 2011-11-15 NOTE — Patient Instructions (Signed)
Limit your sodium (Salt) intake  Annual exam as scheduled

## 2011-12-09 ENCOUNTER — Ambulatory Visit (INDEPENDENT_AMBULATORY_CARE_PROVIDER_SITE_OTHER): Payer: Medicare Other | Admitting: Internal Medicine

## 2011-12-09 ENCOUNTER — Encounter: Payer: Self-pay | Admitting: Internal Medicine

## 2011-12-09 VITALS — BP 150/74

## 2011-12-09 DIAGNOSIS — I10 Essential (primary) hypertension: Secondary | ICD-10-CM

## 2011-12-09 MED ORDER — CLONIDINE HCL 0.1 MG PO TABS: ORAL_TABLET | ORAL | Status: DC

## 2011-12-09 NOTE — Patient Instructions (Signed)
Limit your sodium (Salt) intake    It is important that you exercise regularly, at least 20 minutes 3 to 4 times per week.  If you develop chest pain or shortness of breath seek  medical attention.  Please check your blood pressure on a regular basis.  If it is consistently greater than 150/90, please make an office appointment.  Return in 3 months for follow-up   

## 2011-12-13 ENCOUNTER — Encounter: Payer: Self-pay | Admitting: Internal Medicine

## 2011-12-13 NOTE — Progress Notes (Signed)
  Subjective:    Patient ID: Suzanne Russell, female    DOB: 01/29/1936, 76 y.o.   MRN: [id number]  HPI  76 year old patient who is seen today for followup of her hypertension. She is doing quite well. She is also followed by gynecology. No new concerns or complaints. She is to with amlodipine. She has had no significant peripheral edema. No new concerns or complaints    Review of Systems  Constitutional: Negative.   HENT: Negative for hearing loss, congestion, sore throat, rhinorrhea, dental problem, sinus pressure and tinnitus.   Eyes: Negative for pain, discharge and visual disturbance.  Respiratory: Negative for cough and shortness of breath.   Cardiovascular: Negative for chest pain, palpitations and leg swelling.  Gastrointestinal: Negative for nausea, vomiting, abdominal pain, diarrhea, constipation, blood in stool and abdominal distention.  Genitourinary: Negative for dysuria, urgency, frequency, hematuria, flank pain, vaginal bleeding, vaginal discharge, difficulty urinating, vaginal pain and pelvic pain.  Musculoskeletal: Negative for joint swelling, arthralgias and gait problem.  Skin: Negative for rash.  Neurological: Negative for dizziness, syncope, speech difficulty, weakness, numbness and headaches.  Hematological: Negative for adenopathy.  Psychiatric/Behavioral: Negative for behavioral problems, dysphoric mood and agitation. The patient is not nervous/anxious.        Objective:   Physical Exam  Constitutional: She is oriented to person, place, and time. She appears well-developed and well-nourished.  HENT:  Head: Normocephalic.  Right Ear: External ear normal.  Left Ear: External ear normal.  Mouth/Throat: Oropharynx is clear and moist.  Eyes: Conjunctivae and EOM are normal. Pupils are equal, round, and reactive to light.  Neck: Normal range of motion. Neck supple. No thyromegaly present.  Cardiovascular: Normal rate, regular rhythm, normal heart sounds and intact  distal pulses.   Pulmonary/Chest: Effort normal and breath sounds normal.  Abdominal: Soft. Bowel sounds are normal. She exhibits no mass. There is no tenderness.  Musculoskeletal: Normal range of motion.  Lymphadenopathy:    She has no cervical adenopathy.  Neurological: She is alert and oriented to person, place, and time.  Skin: Skin is warm and dry. No rash noted.  Psychiatric: She has a normal mood and affect. Her behavior is normal.          Assessment & Plan:   Hypertension well controlled. We'll continue present regimen. Recheck 6 months

## 2012-01-28 ENCOUNTER — Encounter: Payer: Medicare Other | Admitting: Internal Medicine

## 2012-02-01 ENCOUNTER — Encounter: Payer: Medicare Other | Admitting: Internal Medicine

## 2012-02-09 ENCOUNTER — Encounter: Payer: Medicare Other | Admitting: Internal Medicine

## 2012-03-24 ENCOUNTER — Other Ambulatory Visit: Payer: Self-pay | Admitting: Internal Medicine

## 2012-04-04 ENCOUNTER — Other Ambulatory Visit: Payer: Self-pay | Admitting: Internal Medicine

## 2012-04-19 ENCOUNTER — Other Ambulatory Visit: Payer: Self-pay | Admitting: Internal Medicine

## 2012-05-11 ENCOUNTER — Ambulatory Visit (INDEPENDENT_AMBULATORY_CARE_PROVIDER_SITE_OTHER): Payer: Medicare Other | Admitting: Internal Medicine

## 2012-05-11 ENCOUNTER — Encounter: Payer: Self-pay | Admitting: Internal Medicine

## 2012-05-11 VITALS — BP 140/72 | Temp 98.3°F | Wt 159.0 lb

## 2012-05-11 DIAGNOSIS — R209 Unspecified disturbances of skin sensation: Secondary | ICD-10-CM

## 2012-05-11 DIAGNOSIS — Z23 Encounter for immunization: Secondary | ICD-10-CM

## 2012-05-11 DIAGNOSIS — K591 Functional diarrhea: Secondary | ICD-10-CM

## 2012-05-11 NOTE — Patient Instructions (Addendum)
Consider a trial off tramadol  Call or return to clinic prn if these symptoms worsen or fail to improve as anticipated.  Return in 3 months for follow-up

## 2012-05-11 NOTE — Progress Notes (Signed)
Subjective:    Patient ID: Suzanne Russell, female    DOB: [birth year], 76 y.o.   MRN: [id number]  HPI  76 year old patient who presents with a chief complaint of diarrhea. She has had intermittent diarrhea over the past but has been felt to be functional. She states over the past 6 months this has been quite severe. She states that nocturnal symptoms her, that interfere with sleep. There's been no weight loss. He states that the diarrhea began at the same time that treatment with tramadol and Neurontin dispense for painful neuropathic pain. She states that she was quite miserable with the dysesthesia and is reluctant to discontinue medication.  Past Medical History  Diagnosis Date  . Menopausal syndrome   . Heart palpitations   . Shingles   . Pedal edema   . HA (headache)   . Hypertension   . Chronic cough     History   Social History  . Marital Status: Married    Spouse Name: N/A    Number of Children: 2  . Years of Education: N/A   Occupational History  . retired Runner, broadcasting/film/video    Social History Main Topics  . Smoking status: Never Smoker   . Smokeless tobacco: Never Used  . Alcohol Use: No  . Drug Use: No  . Sexually Active: Not on file   Other Topics Concern  . Not on file   Social History Narrative  . No narrative on file    Past Surgical History  Procedure Date  . Tonsillectomy   . Colonoscopy 2005    Family History  Problem Relation Age of Onset  . COPD Father   . Coronary artery disease Father   . Cancer Maternal Aunt     breast cancer  . Hypertension Brother   . Cancer Maternal Aunt     breast cancer    Allergies  Allergen Reactions  . Cetirizine Hcl   . Ciprofloxacin     REACTION: unspecified  . Naproxen     REACTION: unspecified    Current Outpatient Prescriptions on File Prior to Visit  Medication Sig Dispense Refill  . amLODipine (NORVASC) 2.5 MG tablet Take 1 tablet (2.5 mg total) by mouth daily.  90 tablet  3  . aspirin 81 MG EC tablet  Take 81 mg by mouth daily.        . Calcium Carbonate (CALTRATE 600) 1500 MG TABS Take by mouth.        . cloNIDine (CATAPRES) 0.1 MG tablet 1 tablet every 6 hours if systolic blood pressure is greater than 170  90 tablet  3  . conjugated estrogens (PREMARIN) vaginal cream Place vaginally as directed. Twice a week  42.5 g  6  . CYMBALTA 30 MG capsule TAKE (1) CAPSULE DAILY.  30 capsule  5  . estradiol (VIVELLE-DOT) 0.05 MG/24HR Place 1 patch onto the skin as directed. Twice a week       . furosemide (LASIX) 40 MG tablet Take 1 tablet (40 mg total) by mouth daily. 60 mg daily  90 tablet  11  . gabapentin (NEURONTIN) 800 MG tablet 1 tablet every 4 hours not to exceed 5 tablets per day  360 tablet  4  . Multiple Vitamin (MULTIVITAMIN) tablet Take 1 tablet by mouth daily.       Marland Kitchen omeprazole (PRILOSEC) 40 MG capsule TAKE (1) CAPSULE DAILY.  90 capsule  3  . progesterone (PROMETRIUM) 200 MG capsule Take 200 mg by mouth as directed. Take  days 1-12 every other month       . telmisartan (MICARDIS) 80 MG tablet Take 1 tablet (80 mg total) by mouth daily.  90 tablet  6  . trimethoprim (TRIMPEX) 100 MG tablet Take 100 mg by mouth as needed.        Marland Kitchen ULTRAM 50 MG tablet TAKE (1) TABLET EVERY SIX HOURS AS NEEDED FOR PAIN.  90 tablet  3    BP 140/72  Temp 98.3 F (36.8 C) (Oral)  Wt 159 lb (72.122 kg)     Wt Readings from Last 3 Encounters:  05/11/12 159 lb (72.122 kg)  11/15/11 158 lb (71.668 kg)  11/04/11 163 lb (73.936 kg)    Review of Systems  Constitutional: Negative.   HENT: Negative for hearing loss, congestion, sore throat, rhinorrhea, dental problem, sinus pressure and tinnitus.   Eyes: Negative for pain, discharge and visual disturbance.  Respiratory: Negative for cough and shortness of breath.   Cardiovascular: Negative for chest pain, palpitations and leg swelling.  Gastrointestinal: Negative for nausea, vomiting, abdominal pain, diarrhea, constipation, blood in stool and abdominal  distention.  Genitourinary: Negative for dysuria, urgency, frequency, hematuria, flank pain, vaginal bleeding, vaginal discharge, difficulty urinating, vaginal pain and pelvic pain.  Musculoskeletal: Negative for joint swelling, arthralgias and gait problem.  Skin: Negative for rash.  Neurological: Negative for dizziness, syncope, speech difficulty, weakness, numbness and headaches.  Hematological: Negative for adenopathy.  Psychiatric/Behavioral: Negative for behavioral problems, dysphoric mood and agitation. The patient is not nervous/anxious.        Objective:   Physical Exam  Constitutional: She is oriented to person, place, and time. She appears well-developed and well-nourished.  HENT:  Head: Normocephalic.  Right Ear: External ear normal.  Left Ear: External ear normal.  Mouth/Throat: Oropharynx is clear and moist.  Eyes: Conjunctivae normal and EOM are normal. Pupils are equal, round, and reactive to light.  Neck: Normal range of motion. Neck supple. No thyromegaly present.  Cardiovascular: Normal rate, regular rhythm, normal heart sounds and intact distal pulses.   Pulmonary/Chest: Effort normal and breath sounds normal.  Abdominal: Soft. Bowel sounds are normal. She exhibits no distension and no mass. There is no tenderness. There is no rebound and no guarding.  Musculoskeletal: Normal range of motion.  Lymphadenopathy:    She has no cervical adenopathy.  Neurological: She is alert and oriented to person, place, and time.  Skin: Skin is warm and dry. No rash noted.  Psychiatric: She has a normal mood and affect. Her behavior is normal.          Assessment & Plan:   Diarrhea probable functional but nocturnal symptoms a bit bothersome. She states that they seem to be related to the time of therapy with tramadol and Neurontin. It was suggested that she give herself a trial off the tramadol first and to continue both Neurontin and Cymbalta.

## 2012-06-13 ENCOUNTER — Other Ambulatory Visit: Payer: Self-pay | Admitting: Gynecology

## 2012-06-26 ENCOUNTER — Other Ambulatory Visit: Payer: Self-pay | Admitting: Internal Medicine

## 2012-08-08 ENCOUNTER — Other Ambulatory Visit: Payer: Self-pay | Admitting: Internal Medicine

## 2012-08-09 ENCOUNTER — Ambulatory Visit (INDEPENDENT_AMBULATORY_CARE_PROVIDER_SITE_OTHER): Payer: Medicare Other | Admitting: Gynecology

## 2012-08-09 ENCOUNTER — Encounter: Payer: Self-pay | Admitting: Gynecology

## 2012-08-09 VITALS — BP 124/76 | Ht 61.0 in | Wt 154.0 lb

## 2012-08-09 DIAGNOSIS — Z780 Asymptomatic menopausal state: Secondary | ICD-10-CM

## 2012-08-09 DIAGNOSIS — G629 Polyneuropathy, unspecified: Secondary | ICD-10-CM | POA: Insufficient documentation

## 2012-08-09 DIAGNOSIS — N952 Postmenopausal atrophic vaginitis: Secondary | ICD-10-CM

## 2012-08-09 DIAGNOSIS — Z79890 Hormone replacement therapy (postmenopausal): Secondary | ICD-10-CM

## 2012-08-09 DIAGNOSIS — N814 Uterovaginal prolapse, unspecified: Secondary | ICD-10-CM

## 2012-08-09 NOTE — Progress Notes (Signed)
Suzanne Russell Oct 21, 1935 [id number]        77 y.o.  [id number] new patient with issues noted below. Former patient of Dr. Nicholas Lose  Past medical history,surgical history, medications, allergies, family history and social history were all reviewed and documented in the EPIC chart. ROS:  Was performed and pertinent positives and negatives are included in the history.  Exam: Kim assistant Filed Vitals:   08/09/12 0949  BP: 124/76  Height: 5\' 1"  (1.549 m)  Weight: 154 lb (69.854 kg)   General appearance  Normal Abdominal  soft, nontender, without masses, organomegaly or hernia Pelvic  Ext/BUS/vagina  Atrophic changes with first degree rectocele  Cervix  normal with to send to one finger breath above the introitus  Uterus  grossly normal size, midline and mobile nontender   Adnexa  Without masses or tenderness    Anus and perineum  normal   Rectovaginal  normal sphincter tone without palpated masses or tenderness. First agree rectocele   Assessment/Plan:  77 y.o. [id number] female. Former patient of Dr. Nicholas Lose. Recently had full exam by him in December 2013. 6 month history of uterine prolapse noticing something protruding from the vagina. Has had several different pessaries placed with difficulty in keeping in place. Her most recent Gellhorn has rotated after 3 days of placement and she has to try to manipulate it to get it back into the proper position. Patient presents to discuss options for management. Patient notes no significant history of urinary complaints previously without significant stress or urgency symptoms. Bladder appears to be well supported without significant cystocele. Does have mild rectocele on exam but no true bulging on straining. Does not have bowel complaints such as stool trapping or difficulties with bowel movements. It appears that her primary complaint is uterine prolapse. Options for management reviewed.  She is a very active 77 year old overall and she wants to maintain  sexual activity. She's she does have a history of hypertension controlled with medication. No cardiac history or diabetes. No physical limitations of activities.  Options of observation, continued attempts at pessary adjustment, surgery to include vaginal hysterectomy with or without BSO, vaginal hysterectomy with or without anterior posterior colporrhaphy. She appears to be well supported anteriorly and I would not recommend the anterior colporrhaphy. Does have some weakness posterior but is not symptomatic and the issues of doing it now versus waiting to see if she would develop symptoms and address this later discussed. I reviewed with her hysterectomy along and the possibilities of having issues with her bladder afterwards and again having to have this readdressed by urology reviewed. Do not think urologic evaluation necessary now as she is not symptomatic as far as urinary loss or retention either with or without the pessary. I did recommend that if we proceed with vaginal hysterectomy and I cannot remove the ovaries vaginally but they appear normal that we stop there and not make separate incisions to retrieve them. I do recommend now checking a baseline ultrasound just to make sure her pelvic pathology is normal as her pelvic exam is somewhat limited by abdominal girth.  She is on HRT having tried to wean several times with significant hot flushes and night sweats. She's on estrogen patch and Prometrium 12 days every 3 months without withdrawal bleeding. I reviewed the WHI study and risks of stroke heart attack DVT and breast cancer. ACOG and NAMS statements the lowest dose for shortest period of time. If we proceed with surgery have recommended that she stop her HRT  preoperatively to minimize the risk of DVT.  Also noted on review of her history that she has not had a bone density in 5 years have recommended a baseline DEXA and she agrees to schedule this.  Patient will follow up for her ultrasound and  decision is for surgery. We will have her primary see her preoperatively for medical clearance.    Dara Lords MD, 10:32 AM 08/09/2012

## 2012-08-09 NOTE — Patient Instructions (Signed)
Follow up for bone density and ultrasound as scheduled. Discuss options at home as we reviewed and we will discuss at the ultrasound appointment.

## 2012-08-16 ENCOUNTER — Ambulatory Visit: Payer: Medicare Other | Admitting: Gynecology

## 2012-08-16 ENCOUNTER — Other Ambulatory Visit: Payer: Medicare Other

## 2012-08-22 ENCOUNTER — Ambulatory Visit (INDEPENDENT_AMBULATORY_CARE_PROVIDER_SITE_OTHER): Payer: Self-pay | Admitting: Internal Medicine

## 2012-08-22 ENCOUNTER — Encounter: Payer: Self-pay | Admitting: Internal Medicine

## 2012-08-22 VITALS — BP 136/80 | HR 80 | Temp 98.4°F | Resp 18 | Ht 61.0 in | Wt 164.0 lb

## 2012-08-22 DIAGNOSIS — G629 Polyneuropathy, unspecified: Secondary | ICD-10-CM

## 2012-08-22 DIAGNOSIS — G589 Mononeuropathy, unspecified: Secondary | ICD-10-CM

## 2012-08-22 DIAGNOSIS — R209 Unspecified disturbances of skin sensation: Secondary | ICD-10-CM

## 2012-08-22 DIAGNOSIS — R609 Edema, unspecified: Secondary | ICD-10-CM

## 2012-08-22 DIAGNOSIS — I10 Essential (primary) hypertension: Secondary | ICD-10-CM

## 2012-08-22 DIAGNOSIS — Z0000 Encounter for general adult medical examination without abnormal findings: Secondary | ICD-10-CM

## 2012-08-22 LAB — CBC WITH DIFFERENTIAL/PLATELET
Basophils Absolute: 0 10*3/uL (ref 0.0–0.1)
Eosinophils Absolute: 0.1 10*3/uL (ref 0.0–0.7)
Hemoglobin: 11.2 g/dL — ABNORMAL LOW (ref 12.0–15.0)
Lymphocytes Relative: 36.3 % (ref 12.0–46.0)
Monocytes Relative: 7.5 % (ref 3.0–12.0)
Neutro Abs: 3.3 10*3/uL (ref 1.4–7.7)
Neutrophils Relative %: 53.7 % (ref 43.0–77.0)
Platelets: 315 10*3/uL (ref 150.0–400.0)
RDW: 13.8 % (ref 11.5–14.6)

## 2012-08-22 LAB — COMPREHENSIVE METABOLIC PANEL
ALT: 10 U/L (ref 0–35)
Albumin: 3.8 g/dL (ref 3.5–5.2)
CO2: 30 mEq/L (ref 19–32)
Calcium: 9.1 mg/dL (ref 8.4–10.5)
Chloride: 101 mEq/L (ref 96–112)
Creatinine, Ser: 0.7 mg/dL (ref 0.4–1.2)
GFR: 81.41 mL/min (ref >60.00)
Potassium: 3.9 mEq/L (ref 3.5–5.1)

## 2012-08-22 LAB — LDL CHOLESTEROL, DIRECT: Direct LDL: 124.7 mg/dL

## 2012-08-22 LAB — LIPID PANEL
Cholesterol: 209 mg/dL — ABNORMAL HIGH (ref 0–200)
Total CHOL/HDL Ratio: 3

## 2012-08-22 LAB — TSH: TSH: 3.25 u[IU]/mL (ref 0.35–5.50)

## 2012-08-22 MED ORDER — TELMISARTAN 80 MG PO TABS: 80.0000 mg | ORAL_TABLET | Freq: Every day | ORAL | Status: DC

## 2012-08-22 MED ORDER — FUROSEMIDE 40 MG PO TABS: 40.0000 mg | ORAL_TABLET | Freq: Every day | ORAL | Status: DC

## 2012-08-22 MED ORDER — TRAMADOL HCL 50 MG PO TABS: ORAL_TABLET | ORAL | Status: DC

## 2012-08-22 MED ORDER — DULOXETINE HCL 30 MG PO CPEP: ORAL_CAPSULE | ORAL | Status: DC

## 2012-08-22 MED ORDER — CLONIDINE HCL 0.1 MG PO TABS: ORAL_TABLET | ORAL | Status: DC

## 2012-08-22 MED ORDER — GABAPENTIN 800 MG PO TABS: ORAL_TABLET | ORAL | Status: DC

## 2012-08-22 MED ORDER — AMLODIPINE BESYLATE 2.5 MG PO TABS: 2.5000 mg | ORAL_TABLET | Freq: Every day | ORAL | Status: DC

## 2012-08-22 NOTE — Patient Instructions (Signed)

## 2012-08-22 NOTE — Progress Notes (Signed)
Subjective:    Patient ID: Suzanne Russell, female    DOB: [birth year], 77 y.o.   MRN: [id number]  HPI  77 year old patient who is seen today for a preventive health examination. She's had recent orthopedic surgery for surgical removal of both second toes. She has done quite well. She's had a recent gynecologic evaluation and is considering an elective vaginal hysterectomy due 2 uterine prolapse. Prior to her orthopedic issues she was walking 1 hour daily without difficulties.  Past Medical History  Diagnosis Date  . Menopausal syndrome   . Heart palpitations   . Shingles   . Pedal edema   . HA (headache)   . Hypertension   . Chronic cough   . Neuropathy     History   Social History  . Marital Status: Married    Spouse Name: N/A    Number of Children: 2  . Years of Education: N/A   Occupational History  . retired Runner, broadcasting/film/video    Social History Main Topics  . Smoking status: Never Smoker   . Smokeless tobacco: Never Used  . Alcohol Use: No  . Drug Use: No  . Sexually Active: No   Other Topics Concern  . Not on file   Social History Narrative  . No narrative on file    Past Surgical History  Procedure Laterality Date  . Tonsillectomy    . Colonoscopy  2005  . Toe amputation      X 2    Family History  Problem Relation Age of Onset  . COPD Father   . Coronary artery disease Father   . Breast cancer Maternal Aunt 45  . Hypertension Brother   . Breast cancer Maternal Aunt 45    Allergies  Allergen Reactions  . Hydrocodone Rash  . Ciprofloxacin     REACTION: unspecified  . Naproxen     REACTION: unspecified    Current Outpatient Prescriptions on File Prior to Visit  Medication Sig Dispense Refill  . amLODipine (NORVASC) 2.5 MG tablet Take 1 tablet (2.5 mg total) by mouth daily.  90 tablet  3  . aspirin 81 MG EC tablet Take 81 mg by mouth daily.        . Calcium Carbonate (CALTRATE 600) 1500 MG TABS Take by mouth.        . cloNIDine (CATAPRES) 0.1 MG  tablet 1 tablet every 6 hours if systolic blood pressure is greater than 170  90 tablet  3  . conjugated estrogens (PREMARIN) vaginal cream Place vaginally as directed. Twice a week  42.5 g  6  . CYMBALTA 30 MG capsule TAKE (1) CAPSULE DAILY.  30 capsule  5  . estradiol (VIVELLE-DOT) 0.05 MG/24HR Place 1 patch onto the skin as directed. Twice a week       . furosemide (LASIX) 40 MG tablet Take 1 tablet (40 mg total) by mouth daily. 60 mg daily  90 tablet  11  . gabapentin (NEURONTIN) 800 MG tablet 1 tablet every 4 hours not to exceed 5 tablets per day  360 tablet  4  . Multiple Vitamin (MULTIVITAMIN) tablet Take 1 tablet by mouth daily.       Marland Kitchen omeprazole (PRILOSEC) 40 MG capsule TAKE (1) CAPSULE DAILY.  90 capsule  3  . progesterone (PROMETRIUM) 200 MG capsule Take 200 mg by mouth as directed. Take days 1-12 every other month       . telmisartan (MICARDIS) 80 MG tablet Take 1 tablet (80 mg total) by  mouth daily.  90 tablet  6  . traMADol (ULTRAM) 50 MG tablet TAKE (1) TABLET EVERY SIX HOURS AS NEEDED FOR PAIN.  90 tablet  0  . trimethoprim (TRIMPEX) 100 MG tablet Take 100 mg by mouth as needed.         No current facility-administered medications on file prior to visit.    BP 136/80  Pulse 80  Temp(Src) 98.4 F (36.9 C) (Oral)  Resp 18  Ht 5\' 1"  (1.549 m)  Wt 164 lb (74.39 kg)  BMI 31 kg/m2  SpO2 96%   1. Risk factors, based on past  M,S,F history-  cardiovascular risk factors include a history of hypertension  2.  Physical activities:  Limited only by orthopedic issues denies any exertional chest pain or exertional dyspnea on exertion  3.  Depression/mood: No history depression or mood disorder  4.  Hearing: No deficits  5.  ADL's: Independent in all aspects of daily living  6.  Fall risk: Low  7.  Home safety: No problems identified  8.  Height weight, and visual acuity; height and weight stable no change in visual acuity. Does obtain annual ophthalmology evaluation  9.   Counseling: Heart healthy diet reck her exercise moderate weight loss all encouraged  10. Lab orders based on risk factors: Laboratory update will be reviewed 11. Referral : Followup OB/GYN  12. Care plan: Plan is for elective vaginal hysterectomy. OB/GYN has scheduled a followup DEXA study  13. Cognitive assessment: Alert and oriented normal affect. No cognitive dysfunction       Review of Systems  Constitutional: Negative for fever, appetite change, fatigue and unexpected weight change.  HENT: Negative for hearing loss, ear pain, nosebleeds, congestion, sore throat, mouth sores, trouble swallowing, neck stiffness, dental problem, voice change, sinus pressure and tinnitus.   Eyes: Negative for photophobia, pain, redness and visual disturbance.  Respiratory: Negative for cough, chest tightness and shortness of breath.   Cardiovascular: Negative for chest pain, palpitations and leg swelling.  Gastrointestinal: Negative for nausea, vomiting, abdominal pain, diarrhea, constipation, blood in stool, abdominal distention and rectal pain.  Genitourinary: Negative for dysuria, urgency, frequency, hematuria, flank pain, vaginal bleeding, vaginal discharge, difficulty urinating, genital sores, vaginal pain, menstrual problem and pelvic pain.  Musculoskeletal: Negative for back pain and arthralgias.  Skin: Negative for rash.  Neurological: Positive for numbness. Negative for dizziness, syncope, speech difficulty, weakness, light-headedness and headaches.  Hematological: Negative for adenopathy. Does not bruise/bleed easily.  Psychiatric/Behavioral: Negative for suicidal ideas, behavioral problems, self-injury, dysphoric mood and agitation. The patient is not nervous/anxious.        Objective:   Physical Exam  Constitutional: She is oriented to person, place, and time. She appears well-developed and well-nourished.  HENT:  Head: Normocephalic and atraumatic.  Right Ear: External ear normal.   Left Ear: External ear normal.  Mouth/Throat: Oropharynx is clear and moist.  Eyes: Conjunctivae and EOM are normal.  Neck: Normal range of motion. Neck supple. No JVD present. No thyromegaly present.  Cardiovascular: Normal rate, regular rhythm, normal heart sounds and intact distal pulses.   No murmur heard. Pulmonary/Chest: Effort normal and breath sounds normal. She has no wheezes. She has no rales.  Abdominal: Soft. Bowel sounds are normal. She exhibits no distension and no mass. There is no tenderness. There is no rebound and no guarding.  Musculoskeletal: Normal range of motion. She exhibits no edema and no tenderness.  Hallux valgus deformities with bilateral second toe amputations  Neurological: She  is alert and oriented to person, place, and time. She has normal reflexes. No cranial nerve deficit. She exhibits normal muscle tone. Coordination normal.  Skin: Skin is warm and dry. No rash noted.  Psychiatric: She has a normal mood and affect. Her behavior is normal.          Assessment & Plan:    Preventive health examination Hypertension stable History of peripheral neuropathy  Continue present regimen Patient consider elective vaginal hysterectomy Recheck 6 months

## 2012-08-23 ENCOUNTER — Telehealth: Payer: Self-pay | Admitting: *Deleted

## 2012-08-23 MED ORDER — FUROSEMIDE 40 MG PO TABS: 40.0000 mg | ORAL_TABLET | Freq: Every day | ORAL | Status: DC

## 2012-08-23 NOTE — Telephone Encounter (Signed)
Rx called into pharmacy needed to clarify pt on 40 mg not 60 mg Furosemide.

## 2012-08-30 ENCOUNTER — Ambulatory Visit (INDEPENDENT_AMBULATORY_CARE_PROVIDER_SITE_OTHER): Payer: PRIVATE HEALTH INSURANCE

## 2012-08-30 ENCOUNTER — Encounter: Payer: Self-pay | Admitting: Gynecology

## 2012-08-30 ENCOUNTER — Ambulatory Visit (INDEPENDENT_AMBULATORY_CARE_PROVIDER_SITE_OTHER): Payer: PRIVATE HEALTH INSURANCE | Admitting: Gynecology

## 2012-08-30 DIAGNOSIS — N83339 Acquired atrophy of ovary and fallopian tube, unspecified side: Secondary | ICD-10-CM

## 2012-08-30 DIAGNOSIS — N814 Uterovaginal prolapse, unspecified: Secondary | ICD-10-CM

## 2012-08-30 DIAGNOSIS — D259 Leiomyoma of uterus, unspecified: Secondary | ICD-10-CM

## 2012-08-30 DIAGNOSIS — D251 Intramural leiomyoma of uterus: Secondary | ICD-10-CM

## 2012-08-30 NOTE — Progress Notes (Signed)
Patient presents for ultrasound in anticipation for upcoming [facility] BSO.   Ultrasound shows several small myomas but otherwise is normal. Endometrial echo 3.3 mm. Right ovaries atrophic. Left ovary is not identified but adnexa is negative.  Assessment and plan: Uterine prolapse, symptomatic to the patient. As you must proceed with [facility] BSO. We'll go ahead and schedule her and she'll represent for a full preoperative consult before hand. She has seen her primary physician who told her that she is cleared for surgery and we will have a written verification.  The need to be off estrogen at least 2 weeks preoperative was discussed.

## 2012-08-30 NOTE — Patient Instructions (Signed)
Office will contact you to arrange surgery. 

## 2012-08-31 ENCOUNTER — Encounter: Payer: Self-pay | Admitting: Internal Medicine

## 2012-08-31 ENCOUNTER — Telehealth: Payer: Self-pay | Admitting: Internal Medicine

## 2012-08-31 NOTE — Telephone Encounter (Signed)
Pt also needs blood work result

## 2012-08-31 NOTE — Telephone Encounter (Signed)
Pt saw MD on 2-18 and needs verbal clearance for hysterectomy surgery sent to dr Audie Box office for.

## 2012-09-01 ENCOUNTER — Encounter (HOSPITAL_COMMUNITY): Payer: Self-pay | Admitting: Pharmacist

## 2012-09-01 ENCOUNTER — Telehealth: Payer: Self-pay | Admitting: *Deleted

## 2012-09-01 NOTE — Telephone Encounter (Signed)
Clearance Letter for surgery sent electronically to Dr. Audie Box.

## 2012-09-05 ENCOUNTER — Encounter (HOSPITAL_COMMUNITY)
Admission: RE | Admit: 2012-09-05 | Discharge: 2012-09-05 | Disposition: A | Discharge status: Home / Self Care | Payer: PRIVATE HEALTH INSURANCE | Source: Ambulatory Visit | Attending: Gynecology | Admitting: Gynecology

## 2012-09-05 ENCOUNTER — Ambulatory Visit (INDEPENDENT_AMBULATORY_CARE_PROVIDER_SITE_OTHER): Payer: PRIVATE HEALTH INSURANCE | Admitting: Gynecology

## 2012-09-05 ENCOUNTER — Other Ambulatory Visit: Payer: Self-pay | Admitting: Gynecology

## 2012-09-05 ENCOUNTER — Encounter: Payer: Self-pay | Admitting: Gynecology

## 2012-09-05 ENCOUNTER — Encounter (HOSPITAL_COMMUNITY): Payer: Self-pay

## 2012-09-05 DIAGNOSIS — E876 Hypokalemia: Secondary | ICD-10-CM

## 2012-09-05 DIAGNOSIS — Z01812 Encounter for preprocedural laboratory examination: Secondary | ICD-10-CM | POA: Insufficient documentation

## 2012-09-05 DIAGNOSIS — E878 Other disorders of electrolyte and fluid balance, not elsewhere classified: Secondary | ICD-10-CM

## 2012-09-05 DIAGNOSIS — N814 Uterovaginal prolapse, unspecified: Secondary | ICD-10-CM

## 2012-09-05 DIAGNOSIS — N816 Rectocele: Secondary | ICD-10-CM

## 2012-09-05 DIAGNOSIS — E875 Hyperkalemia: Secondary | ICD-10-CM

## 2012-09-05 DIAGNOSIS — E871 Hypo-osmolality and hyponatremia: Secondary | ICD-10-CM

## 2012-09-05 DIAGNOSIS — IMO0001 Reserved for inherently not codable concepts without codable children: Secondary | ICD-10-CM

## 2012-09-05 DIAGNOSIS — N952 Postmenopausal atrophic vaginitis: Secondary | ICD-10-CM

## 2012-09-05 DIAGNOSIS — Z01818 Encounter for other preprocedural examination: Secondary | ICD-10-CM | POA: Insufficient documentation

## 2012-09-05 HISTORY — DX: Myoneural disorder, unspecified: G70.9

## 2012-09-05 LAB — CBC
Hemoglobin: 12.1 g/dL (ref 12.0–15.0)
Platelets: 299 10*3/uL (ref 150–400)
RBC: 3.87 MIL/uL (ref 3.87–5.11)
WBC: 6.8 10*3/uL (ref 4.0–10.5)

## 2012-09-05 LAB — COMPREHENSIVE METABOLIC PANEL
ALT: 10 U/L (ref 0–35)
AST: 15 U/L (ref 0–37)
Alkaline Phosphatase: 46 U/L (ref 39–117)
CO2: 26 mEq/L (ref 19–32)
Chloride: 93 mEq/L — ABNORMAL LOW (ref 96–112)
GFR calc non Af Amer: 82 mL/min — ABNORMAL LOW (ref >90)
Sodium: 130 mEq/L — ABNORMAL LOW (ref 135–145)
Total Bilirubin: 0.2 mg/dL — ABNORMAL LOW (ref 0.3–1.2)

## 2012-09-05 NOTE — H&P (Signed)
  Michaeleen Down Yeatman Nov 12, 1935 [id number]   History and Physical  History and Physical  Chief complaint: Uterine prolapse  History of present illness: 77 y.o. G2P2002 6 month history of worsening uterine prolapse. Patient had attempted pessary usage without success.  Exam shows cervix at the introital opening. Vaginal cuff and anterior vaginal wall well supported. Posterior vaginal wall with first degree rectocele. Options for management reviewed and she wants to proceed with total vaginal hysterectomy bilateral salpingo-oophorectomy.  Past medical history,surgical history, medications, allergies, family history and social history were all reviewed and documented in the EPIC chart. ROS:  Was performed and pertinent positives and negatives are included in the history of present illness.  Exam:  Kim assistant General: well developed, well nourished female, no acute distress HEENT: normal  Lungs: clear to auscultation without wheezing, rales or rhonchi  Cardiac: regular rate without rubs, murmurs or gallops  Abdomen: soft, nontender without masses, guarding, rebound, organomegaly  Pelvic: external bus vagina: Atrophic changes   Cervix: grossly normal with prolapse to the level of the introital opening Uterus: normal size, midline and mobile, nontender  Adnexa: without masses or tenderness  Rectovaginal exam first-degree rectocele with exam.    Assessment/Plan:  77 y.o. [id number] with symptomatic uterine prolapse, attempted pessary use unsuccessful. 08/09/2012 office note summarizes her complete situation. Patient admitted for total vaginal hysterectomy bilateral salpingo-oophorectomy. She does have a first-degree rectocele but elects not to have this repaired at this time. She understands there are no guarantees that she will not develop a cystocele rectocele or vaginal cuff prolapse or enterocele following the procedure and accepts the possibility for future procedures. Patient understands that  if it we are unable to retrieve her ovaries transvaginally we will not make a separate incision either laparoscopically or larger to remove them if they appear normal at the time of surgery. The risk of ovarian cancer long-term reviewed versus the risk of separate incisions abdominally and she elects to forego the abdominal incisions and keep her ovaries if we cannot retrieve them vaginally. The patient also understands at any time during the surgery I may convert to a laparoscopic approach  or a total dominant hysterectomy approach if difficulty is encountered or complications occur and she understands and accepts the potential for larger incision and a longer recovery period expected intraoperative and postoperative courses as well as her recovery period were reviewed. The risk of infection requiring prolonged antibiotics as well as abscess or hematoma formation requiring reoperation and drainage was reviewed with her. The risk of hemorrhage necessitating transfusion and the risks of transfusion discussed to include transfusion reaction hepatitis HIV or Mad Cow disease or other entities was discussed with her understood and accepted. Incisional complications if incisions are made  were  reviewed to include open and draining of incisions and closure by secondary intention. Long-term issues of hernia formation and cosmetics discussed. The risk of inadvertent injury to internal organs includin bladder, ureters,  vessels and nerves necessitating major exploratory reparative surgeries and future reparative surgeries includin ostomy formation, bowel resection,  bladder repair and  ureteral damage repair was all discussed with her. Sexuality following hysterectomy was discussed and the potential for persistent orgasmic dysfunction as well as persistent dyspareunia was reviewed. The patient's questions were answered to her satisfaction and she is ready to proceed with surgery.     Dara Lords MD, 2:13 PM  09/05/2012

## 2012-09-05 NOTE — Patient Instructions (Addendum)
Your procedure is scheduled on:09/11/12  Enter through the Main Entrance at :6am Pick up desk phone and dial [phone] and inform us of your arrival.  Please call [phone] if you have any problems the morning of surgery.  Remember: Do not eat or drink after midnight:SUNDAY   Take these meds the morning of surgery with a sip of water: all blood pressure meds, Cymbalta, Prilosec, gabapentin, Tramadol  DO NOT wear jewelry, eye make-up, lipstick,body lotion, or dark fingernail polish. Do not shave for 48 hours prior to surgery.  If you are to be admitted after surgery, leave suitcase in car until your room has been assigned. Patients discharged on the day of surgery will not be allowed to drive home.

## 2012-09-05 NOTE — Patient Instructions (Signed)
Follow up for surgery as scheduled. Call if you have any questions. 

## 2012-09-05 NOTE — Progress Notes (Addendum)
Suzanne Russell [birth year] [id number]   History and Physical  Chief complaint: Uterine prolapse  History of present illness: 77 y.o. G2P2002 6 month history of worsening uterine prolapse. Patient had attempted pessary usage without success.  Exam shows cervix at the introital opening. Vaginal cuff and anterior vaginal wall well supported. Posterior vaginal wall with first degree rectocele. Options for management reviewed and she wants to proceed with total vaginal hysterectomy bilateral salpingo-oophorectomy.  Past medical history,surgical history, medications, allergies, family history and social history were all reviewed and documented in the EPIC chart. ROS:  Was performed and pertinent positives and negatives are included in the history of present illness.  Exam:  Kim assistant General: well developed, well nourished female, no acute distress HEENT: normal  Lungs: clear to auscultation without wheezing, rales or rhonchi  Cardiac: regular rate without rubs, murmurs or gallops  Abdomen: soft, nontender without masses, guarding, rebound, organomegaly  Pelvic: external bus vagina: Atrophic changes   Cervix: grossly normal with prolapse to the level of the introital opening Uterus: normal size, midline and mobile, nontender  Adnexa: without masses or tenderness  Rectovaginal exam first-degree rectocele with exam.    Assessment/Plan:  76 y.o. [id number] with symptomatic uterine prolapse, attempted pessary use unsuccessful. 08/09/2012 office note summarizes her complete situation. Patient admitted for total vaginal hysterectomy bilateral salpingo-oophorectomy. She does have a first-degree rectocele but elects not to have this repaired at this time. She understands there are no guarantees that she will not develop a cystocele rectocele or vaginal cuff prolapse or enterocele following the procedure and accepts the possibility for future procedures.  Patient understands that if it we are unable to  retrieve her ovaries transvaginally we will not make a separate incision either laparoscopically or larger to remove them if they appear normal at the time of surgery. The risk of ovarian cancer long-term reviewed versus the risk of separate incisions abdominally and she elects to forego the abdominal incisions and keep her ovaries if we cannot retrieve them vaginally. The patient also understands at any time during the surgery I may convert to a laparoscopic approach  or a total dominant hysterectomy approach if difficulty is encountered or complications occur and she understands and accepts the potential for larger incision and a longer recovery period expected intraoperative and postoperative courses as well as her recovery period were reviewed. The risk of infection requiring prolonged antibiotics as well as abscess or hematoma formation requiring reoperation and drainage was reviewed with her. The risk of hemorrhage necessitating transfusion and the risks of transfusion discussed to include transfusion reaction hepatitis HIV or Mad Cow disease or other entities was discussed with her understood and accepted. Incisional complications if incisions are made  were  reviewed to include open and draining of incisions and closure by secondary intention. Long-term issues of hernia formation and cosmetics discussed. The risk of inadvertent injury to internal organs includin bladder, ureters,  vessels and nerves necessitating major exploratory reparative surgeries and future reparative surgeries includin ostomy formation, bowel resection,  bladder repair and  ureteral damage repair was all discussed with her. Sexuality following hysterectomy was discussed and the potential for persistent orgasmic dysfunction as well as persistent dyspareunia was reviewed. The patient's questions were answered to her satisfaction and she is ready to proceed with surgery.   Dara Lords MD, 1:59 PM  09/05/2012

## 2012-09-06 ENCOUNTER — Other Ambulatory Visit: Payer: PRIVATE HEALTH INSURANCE

## 2012-09-06 DIAGNOSIS — E878 Other disorders of electrolyte and fluid balance, not elsewhere classified: Secondary | ICD-10-CM

## 2012-09-06 DIAGNOSIS — E875 Hyperkalemia: Secondary | ICD-10-CM

## 2012-09-06 DIAGNOSIS — E871 Hypo-osmolality and hyponatremia: Secondary | ICD-10-CM

## 2012-09-06 LAB — COMPREHENSIVE METABOLIC PANEL
BUN: 14 mg/dL (ref 6–23)
CO2: 26 mEq/L (ref 19–32)
Creat: 0.93 mg/dL (ref 0.50–1.10)
Glucose, Bld: 93 mg/dL (ref 70–99)
Total Bilirubin: 0.3 mg/dL (ref 0.3–1.2)
Total Protein: 7.2 g/dL (ref 6.0–8.3)

## 2012-09-07 ENCOUNTER — Telehealth: Payer: Self-pay

## 2012-09-07 NOTE — Telephone Encounter (Signed)
Message copied by Keenan Bachelor on Thu Sep 07, 2012  9:22 AM ------      Message from: Dara Lords      Created: Thu Sep 07, 2012  8:23 AM       Suzanne Russell patient's preoperative sodium was 130 then repeated 127. Her potassium initially was 5.2 but then repeated was 4.5. I am not going to be able to do surgery if her sodium is 127. She needs to see her primary doctor and have this evaluated before we can do surgery. If this can be accomplished today or tomorrow and her repeat sodiums are normal then we can go ahead but if not we'll have to postpone her surgery until they can see her and clear her...let me know, thanks ------

## 2012-09-07 NOTE — Telephone Encounter (Signed)
Message copied by Keenan Bachelor on Thu Sep 07, 2012 11:57 AM ------      Message from: Dara Lords      Created: Thu Sep 07, 2012  8:23 AM       [name] patient's preoperative sodium was 130 then repeated 127. Her potassium initially was 5.2 but then repeated was 4.5. I am not going to be able to do surgery if her sodium is 127. She needs to see her primary doctor and have this evaluated before we can do surgery. If this can be accomplished today or tomorrow and her repeat sodiums are normal then we can go ahead but if not we'll have to postpone her surgery until they can see her and clear her...let me know, thanks ------

## 2012-09-07 NOTE — Telephone Encounter (Signed)
Patient informed.  She called primary care Dr. Lesia Hausen and he can see her tomorrow morning at 10:30am.

## 2012-09-07 NOTE — Telephone Encounter (Signed)
Left message for patient to call me as soon as possible to talk about her labs.

## 2012-09-08 ENCOUNTER — Ambulatory Visit: Payer: PRIVATE HEALTH INSURANCE | Admitting: Internal Medicine

## 2012-09-11 ENCOUNTER — Telehealth: Payer: Self-pay

## 2012-09-11 ENCOUNTER — Ambulatory Visit (HOSPITAL_COMMUNITY): Admission: RE | Admit: 2012-09-11 | Payer: PRIVATE HEALTH INSURANCE | Source: Ambulatory Visit | Admitting: Gynecology

## 2012-09-11 ENCOUNTER — Encounter (HOSPITAL_COMMUNITY): Admission: RE | Payer: Self-pay | Source: Ambulatory Visit

## 2012-09-11 SURGERY — HYSTERECTOMY, VAGINAL
Anesthesia: General | Laterality: Bilateral

## 2012-09-11 NOTE — Telephone Encounter (Signed)
Patient called today.  Her surgery has since been cancelled as [employer] was closed on Friday due to inclement weather.  Patient wanted to let Dr. Audie Box know that she has appointment tomorrow afternoon with Dr. Vernell Barrier and will keep [place] posted.

## 2012-09-11 NOTE — Telephone Encounter (Signed)
Patient said to tell you thank you for calling her to make sure she did not show up today for surgery.  She said that she called and has appointment for 3:45pm tomorrow with Dr. Lesia Hausen. She will keep [place] posted.

## 2012-09-12 ENCOUNTER — Encounter: Payer: Self-pay | Admitting: Internal Medicine

## 2012-09-12 ENCOUNTER — Ambulatory Visit (INDEPENDENT_AMBULATORY_CARE_PROVIDER_SITE_OTHER): Payer: PRIVATE HEALTH INSURANCE | Admitting: Internal Medicine

## 2012-09-12 VITALS — BP 160/80 | HR 72 | Temp 97.5°F | Resp 20 | Wt 163.0 lb

## 2012-09-12 DIAGNOSIS — R609 Edema, unspecified: Secondary | ICD-10-CM

## 2012-09-12 DIAGNOSIS — I10 Essential (primary) hypertension: Secondary | ICD-10-CM

## 2012-09-12 DIAGNOSIS — E871 Hypo-osmolality and hyponatremia: Secondary | ICD-10-CM

## 2012-09-12 NOTE — Patient Instructions (Signed)
Hold furosemide  Moderate fluid intake  Avoid caffeinated beverages  Please check your blood pressure on a regular basis.  If it is consistently greater than 150/90, please make an office appointment.

## 2012-09-12 NOTE — Progress Notes (Signed)
Subjective:    Patient ID: Suzanne Russell, female    DOB: [birth year], 77 y.o.   MRN: [id number]  HPI  77 year old patient who is seen today to evaluate electrolyte abnormalities. The patient had screening lab here performed on February 18 with a serum sodium of 136 and potassium of 3.9. 7 days ago potassium was 5.2 and sodium level 130.  The patient has a history of hypertension and has used furosemide sparingly. Her medication list states 40 mg daily but the patient states that she takes 20 mg only sparingly.  She denies any nausea or vomiting but states that she does have some chronic diarrhea and basically has been unchanged. Past Medical History  Diagnosis Date  . Menopausal syndrome   . Heart palpitations   . Shingles   . Pedal edema   . HA (headache)   . Hypertension   . Chronic cough   . Neuropathy   . Neuromuscular disorder     nerve damage after shingles    History   Social History  . Marital Status: Married    Spouse Name: N/A    Number of Children: 2  . Years of Education: N/A   Occupational History  . retired Runner, broadcasting/film/video    Social History Main Topics  . Smoking status: Never Smoker   . Smokeless tobacco: Never Used  . Alcohol Use: No  . Drug Use: No  . Sexually Active: No   Other Topics Concern  . Not on file   Social History Narrative  . No narrative on file    Past Surgical History  Procedure Laterality Date  . Tonsillectomy    . Colonoscopy  2005  . Toe amputation      X 2  . Myomectomy vaginal approach  2012    Family History  Problem Relation Age of Onset  . COPD Father   . Coronary artery disease Father   . Breast cancer Maternal Aunt 45  . Hypertension Brother   . Breast cancer Maternal Aunt 45    Allergies  Allergen Reactions  . Hydrocodone Rash  . Ciprofloxacin     Burning  . Naproxen     Tongue swelling    Current Outpatient Prescriptions on File Prior to Visit  Medication Sig Dispense Refill  . amLODipine (NORVASC) 2.5 MG  tablet Take 1 tablet (2.5 mg total) by mouth daily.  90 tablet  3  . aspirin 81 MG EC tablet Take 81 mg by mouth daily.        . Calcium Carbonate (CALTRATE 600) 1500 MG TABS Take 600 mg by mouth daily.       . cloNIDine (CATAPRES) 0.1 MG tablet 1 tablet every 6 hours if systolic blood pressure is greater than 170  90 tablet  3  . DULoxetine (CYMBALTA) 30 MG capsule TAKE (1) CAPSULE DAILY.  30 capsule  5  . furosemide (LASIX) 40 MG tablet Take 1 tablet (40 mg total) by mouth daily.  90 tablet  3  . gabapentin (NEURONTIN) 800 MG tablet Take 800 mg by mouth every 6 (six) hours.      . Multiple Vitamin (MULTIVITAMIN) tablet Take 1 tablet by mouth daily.       Marland Kitchen omeprazole (PRILOSEC) 40 MG capsule TAKE (1) CAPSULE DAILY.  90 capsule  3  . telmisartan (MICARDIS) 80 MG tablet Take 1 tablet (80 mg total) by mouth daily.  90 tablet  6  . traMADol (ULTRAM) 50 MG tablet Take 50 mg by mouth  every 6 (six) hours.      Marland Kitchen trimethoprim (TRIMPEX) 100 MG tablet Take 100 mg by mouth daily as needed.        No current facility-administered medications on file prior to visit.    BP 160/80  Pulse 72  Temp(Src) 97.5 F (36.4 C) (Oral)  Resp 20  Wt 163 lb (73.936 kg)  BMI 30.81 kg/m2  SpO2 97%       Review of Systems  Constitutional: Negative.   HENT: Negative for hearing loss, congestion, sore throat, rhinorrhea, dental problem, sinus pressure and tinnitus.   Eyes: Negative for pain, discharge and visual disturbance.  Respiratory: Negative for cough and shortness of breath.   Cardiovascular: Negative for chest pain, palpitations and leg swelling.  Gastrointestinal: Negative for nausea, vomiting, abdominal pain, diarrhea, constipation, blood in stool and abdominal distention.  Genitourinary: Negative for dysuria, urgency, frequency, hematuria, flank pain, vaginal bleeding, vaginal discharge, difficulty urinating, vaginal pain and pelvic pain.  Musculoskeletal: Negative for joint swelling, arthralgias  and gait problem.  Skin: Negative for rash.  Neurological: Negative for dizziness, syncope, speech difficulty, weakness, numbness and headaches.  Hematological: Negative for adenopathy.  Psychiatric/Behavioral: Negative for behavioral problems, dysphoric mood and agitation. The patient is not nervous/anxious.        Objective:   Physical Exam  Constitutional: She is oriented to person, place, and time. She appears well-developed and well-nourished.  Blood pressure 160/68  HENT:  Head: Normocephalic.  Right Ear: External ear normal.  Left Ear: External ear normal.  Mouth/Throat: Oropharynx is clear and moist.  Eyes: Conjunctivae and EOM are normal. Pupils are equal, round, and reactive to light.  Neck: Normal range of motion. Neck supple. No thyromegaly present.  Cardiovascular: Normal rate, regular rhythm, normal heart sounds and intact distal pulses.   Pulmonary/Chest: Effort normal and breath sounds normal.  Abdominal: Soft. Bowel sounds are normal. She exhibits no mass. There is no tenderness.  Musculoskeletal: Normal range of motion.  Lymphadenopathy:    She has no cervical adenopathy.  Neurological: She is alert and oriented to person, place, and time.  Skin: Skin is warm and dry. No rash noted.  Psychiatric: She has a normal mood and affect. Her behavior is normal.          Assessment & Plan:   Electrolyte abnormalities. We'll discontinue diuretic therapy. The patient has asked to moderate fluid intake and let's thirst sensation be her guide. She is to avoid  excess of fluids and we'll reassess electrolytes in one week. Hypertension. Continue present regimen

## 2012-09-13 ENCOUNTER — Telehealth: Payer: Self-pay

## 2012-09-13 NOTE — Telephone Encounter (Signed)
Patient called to let Dr. Velvet Bathe know that she saw Dr. Vernell Barrier yesterday. (Office note in EPIC).  Patient said that the plan is to recheck labs in a week and go from there.

## 2012-09-18 ENCOUNTER — Other Ambulatory Visit: Payer: Self-pay | Admitting: *Deleted

## 2012-09-18 MED ORDER — TRAMADOL HCL 50 MG PO TABS: 50.0000 mg | ORAL_TABLET | Freq: Four times a day (QID) | ORAL | Status: DC

## 2012-09-19 ENCOUNTER — Other Ambulatory Visit: Payer: PRIVATE HEALTH INSURANCE

## 2012-09-20 ENCOUNTER — Telehealth: Payer: Self-pay | Admitting: *Deleted

## 2012-09-20 ENCOUNTER — Other Ambulatory Visit (INDEPENDENT_AMBULATORY_CARE_PROVIDER_SITE_OTHER): Payer: PRIVATE HEALTH INSURANCE

## 2012-09-20 DIAGNOSIS — E871 Hypo-osmolality and hyponatremia: Secondary | ICD-10-CM

## 2012-09-20 DIAGNOSIS — I10 Essential (primary) hypertension: Secondary | ICD-10-CM

## 2012-09-20 LAB — BASIC METABOLIC PANEL
CO2: 26 mEq/L (ref 19–32)
GFR: 82.68 mL/min (ref >60.00)
Glucose, Bld: 88 mg/dL (ref 70–99)
Potassium: 4.4 mEq/L (ref 3.5–5.1)
Sodium: 127 mEq/L — ABNORMAL LOW (ref 135–145)

## 2012-09-20 NOTE — Telephone Encounter (Signed)
Pt had BMP drawn at Dr. Sueanne Margarita today, pt asked me to relay information for you to review. Please advise

## 2012-09-20 NOTE — Telephone Encounter (Signed)
Pt stopped lasix and her sodium 127.  PLEASE ADVISE

## 2012-09-20 NOTE — Telephone Encounter (Signed)
Pt informed with all the below note and will follow up with PCP.

## 2012-09-20 NOTE — Telephone Encounter (Signed)
And her sodium is still 127. I can discuss again with anesthesia but at the last discussion they were uncomfortable with this number and the question is why is she running low like this i.e. renal disease or some other etiology that her primary physician needs to address.  Suggested she discuss this value with her primary physician and see what they feel is the next step.

## 2012-09-21 NOTE — Telephone Encounter (Signed)
Called and left detailed message on voicemail.

## 2012-09-25 ENCOUNTER — Telehealth: Payer: Self-pay

## 2012-09-25 NOTE — Telephone Encounter (Signed)
Dot just calling to give you update. Said she saw Dr. Kirtland Bouchard Thursday and he gave her some things to try for a week. She is to go back in a week and she is calling to get appt for this Friday or next Monday for recheck.

## 2012-10-02 ENCOUNTER — Ambulatory Visit (INDEPENDENT_AMBULATORY_CARE_PROVIDER_SITE_OTHER): Payer: Medicare Other | Admitting: Internal Medicine

## 2012-10-02 DIAGNOSIS — E871 Hypo-osmolality and hyponatremia: Secondary | ICD-10-CM

## 2012-10-02 LAB — BASIC METABOLIC PANEL
CO2: 29 mEq/L (ref 19–32)
Chloride: 94 mEq/L — ABNORMAL LOW (ref 96–112)
Creatinine, Ser: 0.6 mg/dL (ref 0.4–1.2)

## 2012-10-03 ENCOUNTER — Telehealth: Payer: Self-pay

## 2012-10-03 ENCOUNTER — Other Ambulatory Visit: Payer: Self-pay | Admitting: Internal Medicine

## 2012-10-03 DIAGNOSIS — E871 Hypo-osmolality and hyponatremia: Secondary | ICD-10-CM

## 2012-10-03 DIAGNOSIS — K529 Noninfective gastroenteritis and colitis, unspecified: Secondary | ICD-10-CM

## 2012-10-03 NOTE — Telephone Encounter (Signed)
Patient called to ask if we had seen her results from yesterday.  I did review them and let her know what they were and what Dr. Charm Rings note said but told her she must communicate with his office for results/recommendation as he is caring for her for this condition. She has left a message and will wait for his nurse to call.

## 2012-10-04 ENCOUNTER — Telehealth: Payer: Self-pay | Admitting: Internal Medicine

## 2012-10-04 NOTE — Telephone Encounter (Signed)
Left message on machine to call back  

## 2012-10-05 NOTE — Telephone Encounter (Signed)
Pt was given an appt for 11/09/12 pt was offered an appt with extender and pt declined she only wants to see Dr Leone Payor.  Pt has been having diarrhea that has gotten worse over the past few months.

## 2012-10-12 ENCOUNTER — Telehealth: Payer: Self-pay

## 2012-10-12 NOTE — Telephone Encounter (Signed)
All the labs and notes for Dr. Kirtland Bouchard are in her EPIC chart. However, I printed them for you and they are on your desk.

## 2012-10-12 NOTE — Telephone Encounter (Signed)
Patient said Dr. Kirtland Bouchard has referred her to GI Dr. Leone Payor and her appointment is not until May 8th.  She asked if you might want to check her blood levels before then on the chance they are good and she could get her surgery done.

## 2012-10-12 NOTE — Telephone Encounter (Signed)
Can we get a note of Dr. Charm Rings office visit so we can see what he is thinking about her hyponatremia.

## 2012-10-13 NOTE — Telephone Encounter (Signed)
Dr. Velvet Bathe told me he is going to speak with anesthesia about her current labs on Monday and will see how they feel about it.  I called patient and let her know he will be checking and we will let her know next week.

## 2012-10-17 NOTE — Telephone Encounter (Signed)
Just wanted to see if you spoke with anesthesia about Dot?

## 2012-10-23 NOTE — Telephone Encounter (Signed)
Patient informed Dr. Velvet Bathe spoke with anesthesia who was not comfortable scheduling patient for elective surgery with her current labs.  Dr. Velvet Bathe rec patient follow through with workup.

## 2012-10-30 ENCOUNTER — Telehealth: Payer: Self-pay

## 2012-10-30 NOTE — Telephone Encounter (Signed)
I would recommend Tylenol OTC as directed

## 2012-10-30 NOTE — Telephone Encounter (Signed)
Patient called to ask what you recommend other than Advil or Excedrin for pain?  She c/o having headaches, sometimes lasting 2 days.  Also, some pain in upper right side.  Is to see the GI doctor on May 8.

## 2012-10-30 NOTE — Telephone Encounter (Signed)
Patient advised.

## 2012-11-08 ENCOUNTER — Other Ambulatory Visit: Payer: Self-pay | Admitting: Internal Medicine

## 2012-11-09 ENCOUNTER — Ambulatory Visit (INDEPENDENT_AMBULATORY_CARE_PROVIDER_SITE_OTHER): Payer: PRIVATE HEALTH INSURANCE | Admitting: Internal Medicine

## 2012-11-09 ENCOUNTER — Encounter: Payer: Self-pay | Admitting: Internal Medicine

## 2012-11-09 ENCOUNTER — Other Ambulatory Visit (INDEPENDENT_AMBULATORY_CARE_PROVIDER_SITE_OTHER): Payer: PRIVATE HEALTH INSURANCE

## 2012-11-09 VITALS — BP 128/81 | HR 81 | Ht 61.0 in | Wt 164.0 lb

## 2012-11-09 DIAGNOSIS — K529 Noninfective gastroenteritis and colitis, unspecified: Secondary | ICD-10-CM

## 2012-11-09 DIAGNOSIS — E871 Hypo-osmolality and hyponatremia: Secondary | ICD-10-CM

## 2012-11-09 DIAGNOSIS — R197 Diarrhea, unspecified: Secondary | ICD-10-CM

## 2012-11-09 LAB — BASIC METABOLIC PANEL
BUN: 11 mg/dL (ref 6–23)
GFR: 77.71 mL/min (ref >60.00)
Glucose, Bld: 94 mg/dL (ref 70–99)
Potassium: 4.6 mEq/L (ref 3.5–5.1)

## 2012-11-09 MED ORDER — NA SULFATE-K SULFATE-MG SULF 17.5-3.13-1.6 GM/177ML PO SOLN: ORAL | Status: DC

## 2012-11-09 NOTE — Progress Notes (Signed)
  Subjective:    Patient ID: Suzanne Russell, female    DOB: 1936-03-24, 77 y.o.   MRN: [id number]  HPI The patient is here for evaluation of chronic diarrhea. This has been a problem for the last 4-5 months and is a daily occurrence. She has watery and stringy stools. Never normal. Occasional cramps, no bleeding. No travel, contacts, recent antibiotics or medication changes. Last colonoscopy for screening was 2005. She is gaining weight.  She was preparing to have uterine prolapse surgery but was found to be hyponatremic and given the chronic diarrhea, GI evaluation recommended prior to surgery.  Medications, allergies, past medical history, past surgical history, family history and social history are reviewed and updated in the EMR. Review of Systems All other ROS negative except as in HPI    Objective:   Physical Exam General:  Well-developed, well-nourished and in no acute distress Eyes:  anicteric. ENT:   Mouth and posterior pharynx free of lesions.  Neck:   supple w/o thyromegaly or mass.  Lungs: Clear to auscultation bilaterally. Heart:  S1S2, no rubs, murmurs, gallops. Abdomen:  soft, non-tender, no hepatosplenomegaly, hernia, or mass and BS+.  Rectal: deferred Lymph:  no cervical or supraclavicular adenopathy. Extremities:   no edema Skin   no rash. Neuro:  A&O x 3.  Psych:  appropriate mood and  Affect.   Data Reviewed: PCP notes Labs 2005 colonoscopy     Assessment & Plan:  Chronic diarrhea - Plan: Ambulatory referral to Gastroenterology, Na Sulfate-K Sulfate-Mg Sulf (SUPREP BOWEL PREP) SOLN  Hyponatremia - Plan: Basic Metabolic Panel (BMET), Osmolality, Osmolality, urine, Sodium, urine, random  1. Evaluate diarrhea with colonoscopy, likely random bxs 2. Labs as above also given hyponatremia The risks and benefits as well as alternatives of endoscopic procedure(s) have been discussed and reviewed. All questions answered. The patient agrees to proceed.

## 2012-11-09 NOTE — Patient Instructions (Addendum)
You have been scheduled for a colonoscopy with propofol. Please follow written instructions given to you at your visit today.  Please pick up your prep kit at the pharmacy within the next 1-3 days. If you use inhalers (even only as needed), please bring them with you on the day of your procedure. Your physician has requested that you go to www.startemmi.com and enter the access code given to you at your visit today. This web site gives a general overview about your procedure. However, you should still follow specific instructions given to you by our office regarding your preparation for the procedure.  Your physician has requested that you go to the basement for lab work before leaving today.  Thank you for choosing me and [facility].  Iva Boop, M.D., [facility]

## 2012-11-13 NOTE — Progress Notes (Signed)
Quick Note:  Let her know sodium back to normal ______

## 2012-11-14 ENCOUNTER — Telehealth: Payer: Self-pay | Admitting: *Deleted

## 2012-11-14 ENCOUNTER — Encounter: Payer: Self-pay | Admitting: Internal Medicine

## 2012-11-14 ENCOUNTER — Ambulatory Visit (AMBULATORY_SURGERY_CENTER): Payer: Medicare Other | Admitting: Internal Medicine

## 2012-11-14 VITALS — BP 165/88 | HR 67 | Temp 98.6°F | Resp 22 | Ht 61.0 in | Wt 164.0 lb

## 2012-11-14 DIAGNOSIS — D126 Benign neoplasm of colon, unspecified: Secondary | ICD-10-CM

## 2012-11-14 DIAGNOSIS — R197 Diarrhea, unspecified: Secondary | ICD-10-CM

## 2012-11-14 MED ORDER — SODIUM CHLORIDE 0.9 % IV SOLN: 500.0000 mL | INTRAVENOUS | Status: DC

## 2012-11-14 NOTE — Telephone Encounter (Signed)
Pt wanted to call and say that her Sodium level is now normal and is having a colonoscopy tomorrow and will call soon to schedule surgery with Dr Audie Box. KW

## 2012-11-14 NOTE — Patient Instructions (Addendum)
The colonoscopy was normal. I did take biopsies to see if there is an underlying colitis and will let you know by phone.  I appreciate the opportunity to care for you.  Iva Boop, MD, FACG  YOU HAD AN ENDOSCOPIC PROCEDURE TODAY AT THE [facility]: Refer to the procedure report that was given to you for any specific questions about what was found during the examination.  If the procedure report does not answer your questions, please call your gastroenterologist to clarify.  If you requested that your care partner not be given the details of your procedure findings, then the procedure report has been included in a sealed envelope for you to review at your convenience later.  YOU SHOULD EXPECT: Some feelings of bloating in the abdomen. Passage of more gas than usual.  Walking can help get rid of the air that was put into your GI tract during the procedure and reduce the bloating. If you had a lower endoscopy (such as a colonoscopy or flexible sigmoidoscopy) you may notice spotting of blood in your stool or on the toilet paper. If you underwent a bowel prep for your procedure, then you may not have a normal bowel movement for a few days.  DIET: Your first meal following the procedure should be a light meal and then it is ok to progress to your normal diet.  A half-sandwich or bowl of soup is an example of a good first meal.  Heavy or fried foods are harder to digest and may make you feel nauseous or bloated.  Likewise meals heavy in dairy and vegetables can cause extra gas to form and this can also increase the bloating.  Drink plenty of fluids but you should avoid alcoholic beverages for 24 hours.  ACTIVITY: Your care partner should take you home directly after the procedure.  You should plan to take it easy, moving slowly for the rest of the day.  You can resume normal activity the day after the procedure however you should NOT DRIVE or use heavy machinery for 24 hours (because of the  sedation medicines used during the test).    SYMPTOMS TO REPORT IMMEDIATELY: A gastroenterologist can be reached at any hour.  During normal business hours, 8:30 AM to 5:00 PM Monday through Friday, call [phone].  After hours and on weekends, please call the GI answering service at [phone] who will take a message and have the physician on call contact you.   Following lower endoscopy (colonoscopy or flexible sigmoidoscopy):  Excessive amounts of blood in the stool  Significant tenderness or worsening of abdominal pains  Swelling of the abdomen that is new, acute  Fever of 100F or higher   FOLLOW UP: If any biopsies were taken you will be contacted by phone or by letter within the next 1-3 weeks.  Call your gastroenterologist if you have not heard about the biopsies in 3 weeks.  Our staff will call the home number listed on your records the next business day following your procedure to check on you and address any questions or concerns that you may have at that time regarding the information given to you following your procedure. This is a courtesy call and so if there is no answer at the home number and we have not heard from you through the emergency physician on call, we will assume that you have returned to your regular daily activities without incident.  SIGNATURES/CONFIDENTIALITY: You and/or your care partner have signed paperwork  which will be entered into your electronic medical record.  These signatures attest to the fact that that the information above on your After Visit Summary has been reviewed and is understood.  Full responsibility of the confidentiality of this discharge information lies with you and/or your care-partner.  Dr. Leone Payor will call you with results of colon biopsies.

## 2012-11-14 NOTE — Progress Notes (Signed)
Called to room to assist during endoscopic procedure.  Patient ID and intended procedure confirmed with present staff. Received instructions for my participation in the procedure from the performing physician.  

## 2012-11-14 NOTE — Progress Notes (Signed)
Patient did not experience any of the following events: a burn prior to discharge; a fall within the facility; wrong site/side/patient/procedure/implant event; or a hospital transfer or hospital admission upon discharge from the facility. (G8907) Patient did not have preoperative order for IV antibiotic SSI prophylaxis. (G8918)  

## 2012-11-14 NOTE — Op Note (Signed)
[facility] [address] [place], [address]   COLONOSCOPY PROCEDURE REPORT  PATIENT: Suzanne Russell, Suzanne Russell  MR#: [id number] BIRTHDATE: 10-09-1935 , 76  yrs. old GENDER: Female ENDOSCOPIST: Iva Boop, MD, [facility] PROCEDURE DATE:  11/14/2012 PROCEDURE:   Colonoscopy with biopsy ASA CLASS:   Class II INDICATIONS:chronic diarrhea. MEDICATIONS: propofol (Diprivan) 350mg  IV, MAC sedation, administered by CRNA, and These medications were titrated to patient response per physician's verbal order  DESCRIPTION OF PROCEDURE:   After the risks benefits and alternatives of the procedure were thoroughly explained, informed consent was obtained.  A digital rectal exam revealed no abnormalities of the rectum.   The     endoscope was introduced through the anus and advanced to the terminal ileum which was intubated for a very short  distance. No adverse events experienced.   The quality of the prep was excellent using Suprep The instrument was then slowly withdrawn as the colon was fully examined.      COLON FINDINGS: The mucosa appeared normal in the terminal ileum, distal aspect seen quickly. No photo.   The colonic mucosa appeared normal throughout the entire examined colon.  Multiple random biopsies of the area were performed.  Retroflexed views revealed no abnormalities. The time to cecum=8 minutes 29 seconds.  Withdrawal time=13 minutes 46 seconds.  The scope was withdrawn and the procedure completed. COMPLICATIONS: There were no complications.  ENDOSCOPIC IMPRESSION: 1.   Normal mucosa in the terminal ileum 2.   The colonic mucosa appeared normal throughout the entire examined colon; multiple random biopsies of the area were performed   RECOMMENDATIONS: Office will call with the results.   eSigned:  Iva Boop, MD, [facility] 11/14/2012 3:49 PM   cc: The Patient , Eleonore Chiquito, MD

## 2012-11-15 ENCOUNTER — Telehealth: Payer: Self-pay | Admitting: *Deleted

## 2012-11-15 NOTE — Telephone Encounter (Signed)
  Follow up Call-  Call back number 11/14/2012  Post procedure Call Back phone  # [phone]  Permission to leave phone message Yes     Patient questions:  Do you have a fever, pain , or abdominal swelling? no Pain Score  0 *  Have you tolerated food without any problems? yes  Have you been able to return to your normal activities? yes  Do you have any questions about your discharge instructions: Diet   no Medications  no Follow up visit  no  Do you have questions or concerns about your Care? no  Actions: * If pain score is 4 or above: No action needed, pain <4.

## 2012-11-20 NOTE — Progress Notes (Signed)
Quick Note:  Colon biopsies are ok - normal  ? If gabapentin is causing this  Has she tried to go without this?  No letter and no recall ______

## 2012-11-21 NOTE — Progress Notes (Signed)
Quick Note:  OK - let's have her take 1 loperamide (Imodium daily) and up to 3 more as needed  She might find she needs to take 2 a day regularly  Have her see me in 1-2 moths in the office - call back sooner as needed ______

## 2012-11-23 ENCOUNTER — Telehealth: Payer: Self-pay

## 2012-11-23 NOTE — Telephone Encounter (Signed)
Patient had normal colonoscopy and she said her sodium level was normal at 137. CMET on 11/09/12 was normal (is in [facility] system).  Patient asked what else she might need to do to be able schedule her surgery.

## 2012-11-24 NOTE — Telephone Encounter (Signed)
As her labs are normal looks like we can schedule surgery

## 2012-11-24 NOTE — Telephone Encounter (Signed)
Patient informed. TVH,BSO scheduled for [place] June 18 1:00pm at [facility].  Pre-op consult with Dr. Velvet Bathe scheduled for [place]., June 11 10:20am.

## 2012-12-05 ENCOUNTER — Encounter (HOSPITAL_COMMUNITY): Payer: Self-pay | Admitting: Pharmacist

## 2012-12-07 ENCOUNTER — Encounter (HOSPITAL_COMMUNITY): Payer: Self-pay

## 2012-12-07 ENCOUNTER — Telehealth: Payer: Self-pay

## 2012-12-07 ENCOUNTER — Encounter (HOSPITAL_COMMUNITY)
Admission: RE | Admit: 2012-12-07 | Discharge: 2012-12-07 | Disposition: A | Discharge status: Home / Self Care | Payer: Medicare Other | Source: Ambulatory Visit | Attending: Gynecology | Admitting: Gynecology

## 2012-12-07 ENCOUNTER — Telehealth: Payer: Self-pay | Admitting: Family Medicine

## 2012-12-07 DIAGNOSIS — E871 Hypo-osmolality and hyponatremia: Secondary | ICD-10-CM

## 2012-12-07 DIAGNOSIS — Z01812 Encounter for preprocedural laboratory examination: Secondary | ICD-10-CM | POA: Insufficient documentation

## 2012-12-07 DIAGNOSIS — R899 Unspecified abnormal finding in specimens from other organs, systems and tissues: Secondary | ICD-10-CM

## 2012-12-07 DIAGNOSIS — Z01818 Encounter for other preprocedural examination: Secondary | ICD-10-CM | POA: Insufficient documentation

## 2012-12-07 HISTORY — DX: Gastro-esophageal reflux disease without esophagitis: K21.9

## 2012-12-07 LAB — CBC
HCT: 34.8 % — ABNORMAL LOW (ref 36.0–46.0)
Hemoglobin: 11.5 g/dL — ABNORMAL LOW (ref 12.0–15.0)
MCHC: 33 g/dL (ref 30.0–36.0)
MCV: 92.8 fL (ref 78.0–100.0)
RDW: 13.4 % (ref 11.5–15.5)
WBC: 5.9 10*3/uL (ref 4.0–10.5)

## 2012-12-07 LAB — SURGICAL PCR SCREEN
MRSA, PCR: NEGATIVE
Staphylococcus aureus: NEGATIVE

## 2012-12-07 LAB — COMPREHENSIVE METABOLIC PANEL
Albumin: 3.9 g/dL (ref 3.5–5.2)
Alkaline Phosphatase: 45 U/L (ref 39–117)
BUN: 11 mg/dL (ref 6–23)
Chloride: 95 mEq/L — ABNORMAL LOW (ref 96–112)
Creatinine, Ser: 0.83 mg/dL (ref 0.50–1.10)
GFR calc Af Amer: 79 mL/min — ABNORMAL LOW (ref >90)
GFR calc non Af Amer: 68 mL/min — ABNORMAL LOW (ref >90)
Glucose, Bld: 93 mg/dL (ref 70–99)
Potassium: 4.6 mEq/L (ref 3.5–5.1)
Total Bilirubin: 0.2 mg/dL — ABNORMAL LOW (ref 0.3–1.2)

## 2012-12-07 NOTE — Telephone Encounter (Signed)
[name] from [facility] called

## 2012-12-07 NOTE — Patient Instructions (Addendum)
Your procedure is scheduled on:12/20/12  Enter through the Main Entrance at : 1130 am Pick up desk phone and dial [phone] and inform us of your arrival.  Please call [phone] if you have any problems the morning of surgery.  Remember: Do not eat after midnight:Tuesday---clear liquids ok until 9am on Wed. ( black coffee, tea, water, sodas, jello)  You may brush your teeth the morning of surgery.  Take these meds the morning of surgery with a sip of water: BP meds, Omeprazole, Gabapentin, Tramadol  DO NOT wear jewelry, eye make-up, lipstick,body lotion, or dark fingernail polish.  (Polished toes are ok)   If you are to be admitted after surgery, leave suitcase in car until your room has been assigned. Patients discharged on the day of surgery will not be allowed to drive home. Wear loose fitting, comfortable clothes for your ride home.

## 2012-12-07 NOTE — Telephone Encounter (Signed)
Patient called me back and was informed of all below. She knows she will hear from me first of the week after we hear from Dr. Kirtland Bouchard.

## 2012-12-07 NOTE — Telephone Encounter (Signed)
I spoke to [place] at [facility].  That office has been trying to get her in for surgery.  There have been problems with her lab work.  Sodium and Chloride have been low.  Because of this the surgeon does not want her put to sleep.  Her lab work a few weeks ago were normal but labs done a few days ago were off again but not by much.  Would like to know if pt is okay for surgery on June 18th or should they post pone it again.  Please advise.  Thanks!!

## 2012-12-07 NOTE — Telephone Encounter (Signed)
At Dr. Kristie Cowman request I called Dr. Hillis Range office to let him know that patient's pre-op sodium level and was abnormal again.  His asst said he is out of office until Monday. She will relay to him.  I told her Dr. Velvet Bathe would like to know if he thinks she is okay for surgery and if he needs to see her for further work-up or what he recommends.  She said she will talk with him on Monday and let us know.  I called Marylu Lund at [facility] and she called me back.  She said Dr. Rodman Pickle and Dr. Brayton Caves were both there and she spoke to both of them about this patient. They both said that with a sodium of 129 and if she is stable and has no problems they would be fine with doing her surgery.  Dr. Malen Gauze was actually the anesthesiologist who cancelled her case for it before and he will be in Monday and Marylu Lund said she will talk to him and see what he says. I did let Marylu Lund know I am checking with Dr. Kirtland Bouchard to get clearance for surgery or to find out what he recommends at this point.  I will call her with that info when I get it and she will let me know after talking with Dr. Malen Gauze.  I called patient and left a message on her voice mail to call me. I will let her know all of this when she calls me.

## 2012-12-11 NOTE — Telephone Encounter (Signed)
The patient has difficulty with chronic diarrhea and secondary volume depletion is thought to be the reason for her hyponatremia.  Recommend aggressive control of the diarrhea preoperatively and to check electrolytes again 1 or 2 days prior to anticipated surgery.

## 2012-12-11 NOTE — Telephone Encounter (Signed)
Please see message and advise 

## 2012-12-11 NOTE — Telephone Encounter (Signed)
Please discuss with patient the necessity of controlling her diarrhea well and keeping hydrated prior to surgery and repeat blood testing

## 2012-12-12 ENCOUNTER — Telehealth: Payer: Self-pay | Admitting: *Deleted

## 2012-12-12 NOTE — Telephone Encounter (Signed)
P 

## 2012-12-12 NOTE — Telephone Encounter (Signed)
Discussed pt with Dr. Amador Cunas and he said he would repeat labs BMP for pt on 6/16 and that pt needs to restrict fluids only drink when thirsty being as diarrhea has stopped.  Called pt back told her discussed pt with Dr. Amador Cunas and he said he would repeat labs for pt on 6/16 and that pt needs to restrict fluids only drink when thirsty being as diarrhea has stopped. Pt verbalized understanding. Told her I will have schedulers call her to schedule lab appt for next Monday. Pt verbalized understanding.  Orders for repeat BMP put in EPIC.

## 2012-12-12 NOTE — Telephone Encounter (Signed)
Left message for [name] at [facility] to call me.

## 2012-12-12 NOTE — Telephone Encounter (Signed)
Suzanne Russell from [facility] called. Dr. Ninetta Lights answering for Dr. Malen Gauze today and he has read Dr. Charm Rings note below.  He said as long as Dr. Kirtland Bouchard clears her and check levels day or two before and they do not drop drastically he thinks she is fine for surgery from anesthesia standpoint.  Patient called and I told her that she needs to talk with Dr. Kirtland Bouchard as he is the one to give her clearance. I did tell her note below was from 5:08pm yesterday and his nurse should be calling her today.  She assumes she should keep consult appointment with you for tomorrow in light of this>

## 2012-12-12 NOTE — Telephone Encounter (Signed)
Okay to do there as long as they get done.

## 2012-12-12 NOTE — Pre-Procedure Instructions (Addendum)
I heard back from [place] at Dr. Reynold Bowen office re pt's abnormal sodium result. Repeat electolytes will be repeated 1-2 days prior to surgery per advice from Dr. Lesia Hausen and results will be shared with [place]. Dr. Arby Barrette notified and he agrees with the plan.

## 2012-12-12 NOTE — Pre-Procedure Instructions (Signed)
Repeat sodium result from Dr. Lesia Hausen is 131- ok with Dr. Arby Barrette.

## 2012-12-12 NOTE — Telephone Encounter (Signed)
Yes to keep her appointment and she will need to come by the office 2 days before surgery to check electrolyte panel

## 2012-12-12 NOTE — Telephone Encounter (Signed)
L/M on pt's V/M to C/B

## 2012-12-12 NOTE — Telephone Encounter (Signed)
Spoke to pt told her need to control diarrhea and keep well hydrated prior to surgery and repeat blood testing 1-2 days prior to surgery. Pt verbalized understanding and stated she has not had any diarrhea since she had Colonoscopy done 3 weeks ago and her Sodium was 129 and Dr. Cooper Render was okay with that, but another physician was not. Pt said she does not know why they need Dr.Kwiatkowski's approval for surgery and that they want Dr. Kirtland Bouchard to repeat the labs. Told pt I will discuss with Dr. Amador Cunas and get back to her. Pt verbalized understanding.

## 2012-12-12 NOTE — Telephone Encounter (Addendum)
Dr. Charm Rings nurse called her and has recommended she come to their office on Monday for labs so Dr. Kirtland Bouchard can review them and make recommendation. You had indicated below she should come by the office for labs. Are you okay with her having them done there (as long as they get done)?  Patient will keep consult here tomorrow with Dr. Velvet Bathe.

## 2012-12-12 NOTE — Addendum Note (Signed)
Addended by: Jimmye Norman on: 12/12/2012 11:20 AM   Modules accepted: Orders

## 2012-12-12 NOTE — Telephone Encounter (Signed)
Pt is sch for 12-18-12 at 10.30am

## 2012-12-12 NOTE — Telephone Encounter (Signed)
Dr Velvet Bathe:  I checked EPIC to see if Dr. Kirtland Bouchard had made recommendation for patient. His note reads as follows:  Gordy Savers, MD at 12/11/2012 5:08 PM   Status: Signed            Please discuss with patient the necessity of controlling her diarrhea well and keeping hydrated prior to surgery and repeat blood testing        Gordy Savers, MD at 12/11/2012 12:53 PM    Status: Signed             The patient has difficulty with chronic diarrhea and secondary volume depletion is thought to be the reason for her hyponatremia. Recommend aggressive control of the diarrhea preoperatively and to check electrolytes again 1 or 2 days prior to anticipated surgery   I will fax copy of the note to [facility] Marylu Lund) and I am waiting to hear from her after checking with Dr. Malen Gauze, anesthesiologist.

## 2012-12-13 ENCOUNTER — Ambulatory Visit (INDEPENDENT_AMBULATORY_CARE_PROVIDER_SITE_OTHER): Payer: Medicare Other | Admitting: Gynecology

## 2012-12-13 ENCOUNTER — Encounter: Payer: Self-pay | Admitting: Gynecology

## 2012-12-13 DIAGNOSIS — N814 Uterovaginal prolapse, unspecified: Secondary | ICD-10-CM

## 2012-12-13 DIAGNOSIS — N8111 Cystocele, midline: Secondary | ICD-10-CM

## 2012-12-13 DIAGNOSIS — N816 Rectocele: Secondary | ICD-10-CM

## 2012-12-13 NOTE — Progress Notes (Signed)
Patient presents for preop consult. Had previously been counseled and was ready for surgery but due to have hyponatremia had to be canceled. She has been worked up for this and it was felt due to chronic diarrhea. Her most recent sodium was 129. We discussed with anesthesia and they felt they would be comfortable with this number as long as it was not significantly lower. She's going to see Dr. Amador Cunas for medical clearance he is going to recheck her electrolytes preoperatively. I again reviewed the proposed surgery with her and did a complete physical exam today as noted below. We previously had discussed a straightforward vaginal hysterectomy with bilateral salpingo-oophorectomy but on exam today she does have more of a cystocele and I counseled her for anterior posterior colporrhaphy along with her TVH BSO and that we will make this decision intraoperatively and she is comfortable with that. My suspicion is that we will find a significant cystocele after removing the uterus and I will go ahead and plan repair. She does have some mild SUI symptoms but these are not overly bothersome to her and she does not want to consider sling or other SUI treatment at this time.  Chief complaint: Uterine prolapse, cystocele, rectocele   History of present illness: 77 y.o. [id number] with approximately one year history of worsening symptomatic uterine prolapse, cystocele, rectocele. Patient had attempted pessary usage without success. Exam shows cervix at the introital opening. She does have a cystocele which descends with her cervix.  Posterior vaginal wall with first degree rectocele. Options for management reviewed and she wants to proceed with total vaginal hysterectomy bilateral salpingo-oophorectomy, possible cystocele and rectocele repair.   Past medical history,surgical history, medications, allergies, family history and social history were all reviewed and documented in the EPIC chart.  ROS: Was performed and  pertinent positives and negatives are included in the history of present illness.   Exam: Kim assistant  General: well developed, well nourished female, no acute distress  HEENT: normal  Lungs: clear to auscultation without wheezing, rales or rhonchi  Cardiac: regular rate without rubs, murmurs or gallops  Abdomen: soft, nontender without masses, guarding, rebound, organomegaly  Pelvic: external bus vagina: Atrophic changes with cystocele with descent of the uterus. Mild first degree rectocele. Cervix: grossly normal with prolapse to the level of the introital opening  Uterus: normal size, midline and mobile, nontender  Adnexa: without masses or tenderness  Rectovaginal exam first-degree rectocele with exam.    Assessment/Plan: 77 y.o. [id number] with symptomatic uterine prolapse, cystocele, rectocele, attempted pessary use unsuccessful. 08/09/2012 office note summarizes her complete situation. Her initial scheduled surgery was postponed due to hyponatremia which was thought secondary to chronic diarrhea. She is having a preoperative clearance by her primary physician. Since her initial evaluation and on reexamination her cystocele appears more prominent than previously appreciated. I reviewed with her the plan to proceed with TVH BSO and then reassess the anterior vaginal wall and if significant cystocele then proceed with an anterior colporrhaphy. Also to reassess the posterior vaginal wall and again we may proceed with a posterior colporrhaphy if a significant rectocele. She understands there is no guarantee as far as a permanent relief of her prolapse symptoms. She may develop recurrent cystocele/rectocele in the future that may require reoperation a more aggressive approach such as sling or mesh. Patient understands that if it we are unable to retrieve her ovaries transvaginally we will not make a separate incision either laparoscopically or larger to remove them if they appear normal  at the time of  surgery. The risk of ovarian cancer long-term reviewed versus the risk of separate incisions abdominally and she elects to forego the abdominal incisions and keep her ovaries if we cannot retrieve them vaginally. The patient also understands at any time during the surgery I may convert to a laparoscopic approach or a total dominant hysterectomy approach if difficulty is encountered or complications occur and she understands and accepts the potential for larger incision and a longer recovery period. The expected intraoperative and postoperative courses as well as her recovery period were reviewed. The risk of infection requiring prolonged antibiotics as well as abscess or hematoma formation requiring reoperation and drainage was reviewed with her. The risk of hemorrhage necessitating transfusion and the risks of transfusion discussed to include transfusion reaction hepatitis HIV or [place] disease or other entities was discussed with her understood and accepted. Incisional complications if incisions are made were reviewed to include open and draining of incisions and closure by secondary intention. Long-term issues of hernia formation and cosmetics discussed. The risk of inadvertent injury to internal organs including bowel, bladder, ureters, vessels and nerves necessitating major exploratory reparative surgeries and future reparative surgeries including ostomy formation, bowel resection, bladder repair and ureteral damage repair was all discussed with her. The realistic anatomy of the surgery to include the close proximity of bladder and bowel discussed and understood. Post operative bladder function particularly with cystocele repair was also reviewed she knows her no guarantees as far as bladder functioning and continence following the surgery. She may develop urgency or stress urinary incontinence type symptoms and that she may need further evaluation by urology and surgery as needed. Sexuality following  hysterectomy was discussed and the potential for persistent orgasmic dysfunction as well as persistent dyspareunia was reviewed. She understands that if we do proceed with the anterior-posterior colporrhaphy at vaginal tightness may be an issue requiring dilatation following the procedure. The patient's questions were answered to her satisfaction and she is ready to proceed with surgery.

## 2012-12-13 NOTE — H&P (Signed)
Suzanne Russell 08/17/1935 [id number]   History and Physical  Chief complaint: Uterine prolapse, cystocele, rectocele   History of present illness: 77 y.o. G2P2002 with approximately one year history of worsening symptomatic uterine prolapse, cystocele, rectocele. Patient had attempted pessary usage without success. Exam shows cervix at the introital opening. She does have a cystocele which descends with her cervix.  Posterior vaginal wall with first degree rectocele. Options for management reviewed and she wants to proceed with total vaginal hysterectomy bilateral salpingo-oophorectomy, possible cystocele and rectocele repair.   Past medical history,surgical history, medications, allergies, family history and social history were all reviewed and documented in the EPIC chart.  ROS: Was performed and pertinent positives and negatives are included in the history of present illness.   Exam: Kim assistant  General: well developed, well nourished female, no acute distress  HEENT: normal  Lungs: clear to auscultation without wheezing, rales or rhonchi  Cardiac: regular rate without rubs, murmurs or gallops  Abdomen: soft, nontender without masses, guarding, rebound, organomegaly  Pelvic: external bus vagina: Atrophic changes with cystocele with descent of the uterus. Mild first degree rectocele. Cervix: grossly normal with prolapse to the level of the introital opening  Uterus: normal size, midline and mobile, nontender  Adnexa: without masses or tenderness  Rectovaginal exam first-degree rectocele with exam.    Assessment/Plan: 77 y.o. [id number] with symptomatic uterine prolapse, cystocele, rectocele, attempted pessary use unsuccessful. 08/09/2012 office note summarizes her complete situation. Her initial scheduled surgery was postponed due to hyponatremia which was thought secondary to chronic diarrhea. She is having a preoperative clearance by her primary physician. Since her initial evaluation  and on reexamination her cystocele appears more prominent than previously appreciated. I reviewed with her the plan to proceed with TVH BSO and then reassess the anterior vaginal wall and if significant cystocele then proceed with an anterior colporrhaphy. Also to reassess the posterior vaginal wall and again we may proceed with a posterior colporrhaphy if a significant rectocele. She understands there is no guarantee as far as a permanent relief of her prolapse symptoms. She may develop recurrent cystocele/rectocele in the future that may require reoperation a more aggressive approach such as sling or mesh. Patient understands that if it we are unable to retrieve her ovaries transvaginally we will not make a separate incision either laparoscopically or larger to remove them if they appear normal at the time of surgery. The risk of ovarian cancer long-term reviewed versus the risk of separate incisions abdominally and she elects to forego the abdominal incisions and keep her ovaries if we cannot retrieve them vaginally. The patient also understands at any time during the surgery I may convert to a laparoscopic approach or a total dominant hysterectomy approach if difficulty is encountered or complications occur and she understands and accepts the potential for larger incision and a longer recovery period. The expected intraoperative and postoperative courses as well as her recovery period were reviewed. The risk of infection requiring prolonged antibiotics as well as abscess or hematoma formation requiring reoperation and drainage was reviewed with her. The risk of hemorrhage necessitating transfusion and the risks of transfusion discussed to include transfusion reaction hepatitis HIV or Mad Cow disease or other entities was discussed with her understood and accepted. Incisional complications if incisions are made were reviewed to include open and draining of incisions and closure by secondary intention. Long-term  issues of hernia formation and cosmetics discussed. The risk of inadvertent injury to internal organs including bowel, bladder, ureters, vessels  and nerves necessitating major exploratory reparative surgeries and future reparative surgeries including ostomy formation, bowel resection, bladder repair and ureteral damage repair was all discussed with her. The realistic anatomy of the surgery to include the close proximity of bladder and bowel discussed and understood. Post operative bladder function particularly with cystocele repair was also reviewed she knows her no guarantees as far as bladder functioning and continence following the surgery. She may develop urgency or stress urinary incontinence type symptoms and that she may need further evaluation by urology and surgery as needed. Sexuality following hysterectomy was discussed and the potential for persistent orgasmic dysfunction as well as persistent dyspareunia was reviewed. She understands that if we do proceed with the anterior-posterior colporrhaphy at vaginal tightness may be an issue requiring dilatation following the procedure. The patient's questions were answered to her satisfaction and she is ready to proceed with surgery.   Dara Lords MD, 12:56 PM 12/13/2012

## 2012-12-13 NOTE — Patient Instructions (Signed)
Followup for surgery as scheduled. 

## 2012-12-14 NOTE — Telephone Encounter (Signed)
Patient informed. 

## 2012-12-15 ENCOUNTER — Telehealth: Payer: Self-pay

## 2012-12-15 NOTE — Telephone Encounter (Signed)
Lupita Leash from Dr. Ninfa Linden office called in voice mail. I returned her call to let her know I have viewed Dr. Charm Rings notes in [facility] and have communicated with patient. Dr. Velvet Bathe is aware patient coming to their office Monday for labs and we will be waiting on Dr. Charm Rings recommendation whether he thinks she is okay for surgery.

## 2012-12-15 NOTE — Telephone Encounter (Signed)
[name] left me a voicemail stating she knows pt is coming in on Monday for lab work and that they are just wanting on Dr.K clearance for surgery.

## 2012-12-15 NOTE — Telephone Encounter (Signed)
Left message for Suzanne Russell at [facility] on voicemail to call me.

## 2012-12-18 ENCOUNTER — Other Ambulatory Visit (INDEPENDENT_AMBULATORY_CARE_PROVIDER_SITE_OTHER): Payer: Medicare Other

## 2012-12-18 DIAGNOSIS — R899 Unspecified abnormal finding in specimens from other organs, systems and tissues: Secondary | ICD-10-CM

## 2012-12-18 DIAGNOSIS — E871 Hypo-osmolality and hyponatremia: Secondary | ICD-10-CM

## 2012-12-18 DIAGNOSIS — R6889 Other general symptoms and signs: Secondary | ICD-10-CM

## 2012-12-18 LAB — BASIC METABOLIC PANEL
BUN: 14 mg/dL (ref 6–23)
Calcium: 9.4 mg/dL (ref 8.4–10.5)
Chloride: 94 mEq/L — ABNORMAL LOW (ref 96–112)
Creatinine, Ser: 0.9 mg/dL (ref 0.4–1.2)
GFR: 64.89 mL/min (ref >60.00)

## 2012-12-19 ENCOUNTER — Telehealth: Payer: Self-pay

## 2012-12-19 MED ORDER — CEFOTETAN DISODIUM 2 G IJ SOLR
2.0000 g | INTRAMUSCULAR | Status: AC
  Administered 2012-12-20: 2 g via INTRAVENOUS
  Filled 2012-12-19: qty 2

## 2012-12-19 NOTE — Telephone Encounter (Signed)
Dr. Kirtland Bouchard please see labs from 6/16 and advise. Pt is scheduled for surgery tomorrow 6/18 with Dr. Cooper Render they are waiting on clearance from you.

## 2012-12-19 NOTE — Telephone Encounter (Signed)
Dr. Lesia Hausen called today to say he reviewed Suzanne Russell's results. Sodium slightly improved from last time.  He feels since this appears to be chronic condition that patient will do fine with surgery.  He said ultimately Dr. Velvet Bathe has final say.  Dr. Velvet Bathe asked me to check with anesthesia and see if they are okay and if they are okay with it he is fine with it.  I spoke with Suzanne Lund, RN at [facility] and she has already checked with anesthesiology and they are fine with it as well.  I called the patient and let her know that her surgery is on go as planned for tomorrow.

## 2012-12-20 ENCOUNTER — Encounter (HOSPITAL_COMMUNITY): Payer: Self-pay | Admitting: Anesthesiology

## 2012-12-20 ENCOUNTER — Ambulatory Visit (HOSPITAL_COMMUNITY)
Admission: RE | Admit: 2012-12-20 | Discharge: 2012-12-21 | Disposition: A | Discharge status: Home / Self Care | Payer: Medicare Other | Source: Ambulatory Visit | Attending: Gynecology | Admitting: Gynecology

## 2012-12-20 ENCOUNTER — Encounter (HOSPITAL_COMMUNITY)
Admission: RE | Disposition: A | Discharge status: Home / Self Care | Payer: Self-pay | Source: Ambulatory Visit | Attending: Gynecology

## 2012-12-20 ENCOUNTER — Inpatient Hospital Stay (HOSPITAL_COMMUNITY): Payer: Medicare Other | Admitting: Anesthesiology

## 2012-12-20 DIAGNOSIS — N816 Rectocele: Secondary | ICD-10-CM

## 2012-12-20 DIAGNOSIS — N8000 Endometriosis of the uterus, unspecified: Secondary | ICD-10-CM | POA: Insufficient documentation

## 2012-12-20 DIAGNOSIS — D251 Intramural leiomyoma of uterus: Secondary | ICD-10-CM | POA: Insufficient documentation

## 2012-12-20 DIAGNOSIS — N812 Incomplete uterovaginal prolapse: Principal | ICD-10-CM | POA: Insufficient documentation

## 2012-12-20 DIAGNOSIS — N8111 Cystocele, midline: Secondary | ICD-10-CM

## 2012-12-20 DIAGNOSIS — N814 Uterovaginal prolapse, unspecified: Secondary | ICD-10-CM

## 2012-12-20 HISTORY — PX: ANTERIOR AND POSTERIOR REPAIR: SHX5121

## 2012-12-20 HISTORY — PX: VAGINAL HYSTERECTOMY: SHX2639

## 2012-12-20 SURGERY — HYSTERECTOMY, VAGINAL
Anesthesia: General | Site: Vagina | Wound class: Clean Contaminated

## 2012-12-20 MED ORDER — FENTANYL CITRATE 0.05 MG/ML IJ SOLN
INTRAMUSCULAR | Status: AC
  Filled 2012-12-20: qty 4

## 2012-12-20 MED ORDER — DIPHENHYDRAMINE HCL 25 MG PO CAPS: 50.0000 mg | ORAL_CAPSULE | Freq: Four times a day (QID) | ORAL | Status: DC | PRN

## 2012-12-20 MED ORDER — ONDANSETRON HCL 4 MG PO TABS: 4.0000 mg | ORAL_TABLET | Freq: Four times a day (QID) | ORAL | Status: DC | PRN

## 2012-12-20 MED ORDER — HYDROMORPHONE HCL PF 1 MG/ML IJ SOLN
0.2000 mg | INTRAMUSCULAR | Status: DC | PRN
  Administered 2012-12-20: 0.2 mg via INTRAVENOUS
  Administered 2012-12-21: 0.6 mg via INTRAVENOUS
  Administered 2012-12-21: 0.4 mg via INTRAVENOUS
  Administered 2012-12-21: 0.2 mg via INTRAVENOUS
  Filled 2012-12-20 (×4): qty 1

## 2012-12-20 MED ORDER — LACTATED RINGERS IV SOLN
INTRAVENOUS | Status: DC | PRN
  Administered 2012-12-20 (×2): via INTRAVENOUS

## 2012-12-20 MED ORDER — PANTOPRAZOLE SODIUM 40 MG PO TBEC
80.0000 mg | DELAYED_RELEASE_TABLET | Freq: Every day | ORAL | Status: DC
  Filled 2012-12-20: qty 2

## 2012-12-20 MED ORDER — NEOSTIGMINE METHYLSULFATE 1 MG/ML IJ SOLN
INTRAMUSCULAR | Status: AC
  Filled 2012-12-20: qty 1

## 2012-12-20 MED ORDER — DEXTROSE-NACL 5-0.9 % IV SOLN: INTRAVENOUS | Status: DC

## 2012-12-20 MED ORDER — CLONIDINE HCL 0.1 MG PO TABS
0.1000 mg | ORAL_TABLET | Freq: Every day | ORAL | Status: DC
  Filled 2012-12-20: qty 1

## 2012-12-20 MED ORDER — ESTRADIOL 0.1 MG/GM VA CREA
TOPICAL_CREAM | VAGINAL | Status: DC | PRN
  Administered 2012-12-20: 1 via VAGINAL

## 2012-12-20 MED ORDER — ROCURONIUM BROMIDE 50 MG/5ML IV SOLN
INTRAVENOUS | Status: AC
  Filled 2012-12-20: qty 1

## 2012-12-20 MED ORDER — GLYCOPYRROLATE 0.2 MG/ML IJ SOLN
INTRAMUSCULAR | Status: AC
  Filled 2012-12-20: qty 3

## 2012-12-20 MED ORDER — AMLODIPINE BESYLATE 5 MG PO TABS
2.5000 mg | ORAL_TABLET | Freq: Every day | ORAL | Status: DC
  Filled 2012-12-20: qty 0.5

## 2012-12-20 MED ORDER — ROCURONIUM BROMIDE 100 MG/10ML IV SOLN
INTRAVENOUS | Status: DC | PRN
  Administered 2012-12-20: 40 mg via INTRAVENOUS

## 2012-12-20 MED ORDER — LIDOCAINE HCL (CARDIAC) 20 MG/ML IV SOLN
INTRAVENOUS | Status: DC | PRN
  Administered 2012-12-20: 80 mg via INTRAVENOUS

## 2012-12-20 MED ORDER — DEXAMETHASONE SODIUM PHOSPHATE 4 MG/ML IJ SOLN
INTRAMUSCULAR | Status: DC | PRN
  Administered 2012-12-20: 10 mg via INTRAVENOUS

## 2012-12-20 MED ORDER — NEOSTIGMINE METHYLSULFATE 1 MG/ML IJ SOLN
INTRAMUSCULAR | Status: DC | PRN
  Administered 2012-12-20: 3 mg via INTRAVENOUS

## 2012-12-20 MED ORDER — TRAMADOL HCL 50 MG PO TABS
50.0000 mg | ORAL_TABLET | Freq: Four times a day (QID) | ORAL | Status: DC
  Administered 2012-12-20 – 2012-12-21 (×3): 50 mg via ORAL
  Filled 2012-12-20 (×3): qty 1

## 2012-12-20 MED ORDER — ONDANSETRON HCL 4 MG/2ML IJ SOLN
INTRAMUSCULAR | Status: DC | PRN
  Administered 2012-12-20: 4 mg via INTRAVENOUS

## 2012-12-20 MED ORDER — ONDANSETRON HCL 4 MG/2ML IJ SOLN: 4.0000 mg | Freq: Four times a day (QID) | INTRAMUSCULAR | Status: DC | PRN

## 2012-12-20 MED ORDER — METOCLOPRAMIDE HCL 5 MG/ML IJ SOLN: 10.0000 mg | Freq: Once | INTRAMUSCULAR | Status: DC | PRN

## 2012-12-20 MED ORDER — LACTATED RINGERS IV SOLN: INTRAVENOUS | Status: DC

## 2012-12-20 MED ORDER — IRBESARTAN 75 MG PO TABS
75.0000 mg | ORAL_TABLET | Freq: Every day | ORAL | Status: DC
  Filled 2012-12-20: qty 1

## 2012-12-20 MED ORDER — GLYCOPYRROLATE 0.2 MG/ML IJ SOLN
INTRAMUSCULAR | Status: DC | PRN
  Administered 2012-12-20: .5 mg via INTRAVENOUS

## 2012-12-20 MED ORDER — FUROSEMIDE 40 MG PO TABS
40.0000 mg | ORAL_TABLET | Freq: Every day | ORAL | Status: DC
  Filled 2012-12-20 (×2): qty 1

## 2012-12-20 MED ORDER — MIDAZOLAM HCL 2 MG/2ML IJ SOLN
INTRAMUSCULAR | Status: AC
  Filled 2012-12-20: qty 2

## 2012-12-20 MED ORDER — ONDANSETRON HCL 4 MG/2ML IJ SOLN
INTRAMUSCULAR | Status: AC
  Filled 2012-12-20: qty 2

## 2012-12-20 MED ORDER — DEXAMETHASONE SODIUM PHOSPHATE 10 MG/ML IJ SOLN
INTRAMUSCULAR | Status: AC
  Filled 2012-12-20: qty 1

## 2012-12-20 MED ORDER — 0.9 % SODIUM CHLORIDE (POUR BTL) OPTIME
TOPICAL | Status: DC | PRN
  Administered 2012-12-20: 1000 mL

## 2012-12-20 MED ORDER — DULOXETINE HCL 30 MG PO CPEP
30.0000 mg | ORAL_CAPSULE | Freq: Every day | ORAL | Status: DC
  Filled 2012-12-20: qty 1

## 2012-12-20 MED ORDER — LIDOCAINE-EPINEPHRINE 1 %-1:100000 IJ SOLN
INTRAMUSCULAR | Status: DC | PRN
  Administered 2012-12-20: 10 mL

## 2012-12-20 MED ORDER — FENTANYL CITRATE 0.05 MG/ML IJ SOLN
INTRAMUSCULAR | Status: AC
  Filled 2012-12-20: qty 5

## 2012-12-20 MED ORDER — PROPOFOL 10 MG/ML IV BOLUS
INTRAVENOUS | Status: DC | PRN
  Administered 2012-12-20: 150 mg via INTRAVENOUS

## 2012-12-20 MED ORDER — PROPOFOL 10 MG/ML IV EMUL
INTRAVENOUS | Status: AC
  Filled 2012-12-20: qty 20

## 2012-12-20 MED ORDER — MEPERIDINE HCL 25 MG/ML IJ SOLN: 6.2500 mg | INTRAMUSCULAR | Status: DC | PRN

## 2012-12-20 MED ORDER — GABAPENTIN 400 MG PO CAPS
800.0000 mg | ORAL_CAPSULE | Freq: Four times a day (QID) | ORAL | Status: DC
  Administered 2012-12-20 – 2012-12-21 (×3): 800 mg via ORAL
  Filled 2012-12-20 (×4): qty 2

## 2012-12-20 MED ORDER — EPHEDRINE SULFATE 50 MG/ML IJ SOLN
INTRAMUSCULAR | Status: DC | PRN
  Administered 2012-12-20: 5 mg via INTRAVENOUS
  Administered 2012-12-20: 10 mg via INTRAVENOUS
  Administered 2012-12-20: 5 mg via INTRAVENOUS

## 2012-12-20 MED ORDER — LIDOCAINE HCL (CARDIAC) 20 MG/ML IV SOLN
INTRAVENOUS | Status: AC
  Filled 2012-12-20: qty 5

## 2012-12-20 MED ORDER — ESTRADIOL 0.1 MG/GM VA CREA
TOPICAL_CREAM | VAGINAL | Status: AC
  Filled 2012-12-20: qty 42.5

## 2012-12-20 MED ORDER — FENTANYL CITRATE 0.05 MG/ML IJ SOLN
25.0000 ug | INTRAMUSCULAR | Status: AC | PRN
  Administered 2012-12-20 (×6): 50 ug via INTRAVENOUS

## 2012-12-20 MED ORDER — FENTANYL CITRATE 0.05 MG/ML IJ SOLN
INTRAMUSCULAR | Status: DC | PRN
  Administered 2012-12-20 (×5): 50 ug via INTRAVENOUS

## 2012-12-20 MED ORDER — DEXTROSE-NACL 5-0.9 % IV SOLN
INTRAVENOUS | Status: DC
  Administered 2012-12-20 – 2012-12-21 (×2): via INTRAVENOUS

## 2012-12-20 SURGICAL SUPPLY — 31 items
CANISTER SUCTION 2500CC (MISCELLANEOUS) ×3 IMPLANT
CLOTH BEACON ORANGE TIMEOUT ST (SAFETY) ×3 IMPLANT
CONT PATH 16OZ SNAP LID 3702 (MISCELLANEOUS) IMPLANT
CONTAINER PREFILL 10% NBF 60ML (FORM) IMPLANT
DECANTER SPIKE VIAL GLASS SM (MISCELLANEOUS) ×3 IMPLANT
DRESSING TELFA 8X3 (GAUZE/BANDAGES/DRESSINGS) ×3 IMPLANT
GAUZE PACKING 2X5 YD STERILE (GAUZE/BANDAGES/DRESSINGS) IMPLANT
GLOVE BIO SURGEON STRL SZ7.5 (GLOVE) ×3 IMPLANT
GLOVE BIOGEL PI IND STRL 6.5 (GLOVE) ×2 IMPLANT
GLOVE BIOGEL PI INDICATOR 6.5 (GLOVE) ×1
GOWN STRL REIN XL XLG (GOWN DISPOSABLE) ×12 IMPLANT
NDL SPNL 18GX3.5 QUINCKE PK (NEEDLE) ×1 IMPLANT
NDL SPNL 22GX3.5 QUINCKE BK (NEEDLE) IMPLANT
NEEDLE HYPO 22GX1.5 SAFETY (NEEDLE) IMPLANT
NEEDLE MAYO .5 CIRCLE (NEEDLE) IMPLANT
NEEDLE SPNL 18GX3.5 QUINCKE PK (NEEDLE) ×3 IMPLANT
NEEDLE SPNL 22GX3.5 QUINCKE BK (NEEDLE) ×3 IMPLANT
NS IRRIG 1000ML POUR BTL (IV SOLUTION) ×3 IMPLANT
PACK VAGINAL WOMENS (CUSTOM PROCEDURE TRAY) ×3 IMPLANT
PAD OB MATERNITY 4.3X12.25 (PERSONAL CARE ITEMS) ×3 IMPLANT
SUT VIC AB 0 CT1 18XCR BRD8 (SUTURE) ×6 IMPLANT
SUT VIC AB 0 CT1 36 (SUTURE) ×3 IMPLANT
SUT VIC AB 0 CT1 8-18 (SUTURE) ×9
SUT VIC AB 2-0 SH 27 (SUTURE) ×12
SUT VIC AB 2-0 SH 27XBRD (SUTURE) ×4 IMPLANT
SUT VICRYL 0 TIES 12 18 (SUTURE) ×3 IMPLANT
SUT VICRYL 3 0 BR 18  UND (SUTURE) ×1
SUT VICRYL 3 0 BR 18 UND (SUTURE) ×1 IMPLANT
TOWEL OR 17X24 6PK STRL BLUE (TOWEL DISPOSABLE) ×6 IMPLANT
TRAY FOLEY CATH 14FR (SET/KITS/TRAYS/PACK) ×3 IMPLANT
WATER STERILE IRR 1000ML POUR (IV SOLUTION) ×3 IMPLANT

## 2012-12-20 NOTE — Progress Notes (Signed)
Patient ID: Suzanne Russell, female   DOB: [birth year], 77 y.o.   MRN: [id number] In to see Patient  Awake, Alert Minimal discomfort  Results of surgery reviewed with patient and husband.  Plan to pull Vag packing in am, d/c foley and discharge home after voiding.

## 2012-12-20 NOTE — Anesthesia Postprocedure Evaluation (Signed)
Anesthesia Post Note  Patient: Suzanne Russell  Procedure(s) Performed: Procedure(s) (LRB): TOTAL VAGINAL HYSTERECTOMY (N/A) ANTERIOR (CYSTOCELE) AND POSTERIOR REPAIR (RECTOCELE) (N/A)  Anesthesia type: General  Patient location: PACU  Post pain: Pain level controlled  Post assessment: Post-op Vital signs reviewed  Last Vitals:  Filed Vitals:   12/20/12 1500  BP: 118/49  Pulse: 71  Temp:   Resp: 12    Post vital signs: Reviewed  Level of consciousness: sedated  Complications: No apparent anesthesia complications

## 2012-12-20 NOTE — Anesthesia Postprocedure Evaluation (Signed)
Anesthesia Post Note  Patient: Suzanne Russell  Procedure(s) Performed: Procedure(s) (LRB): TOTAL VAGINAL HYSTERECTOMY (N/A) ANTERIOR (CYSTOCELE) AND POSTERIOR REPAIR (RECTOCELE) (N/A)  Anesthesia type: General  Patient location: PACU  Post pain: Pain level controlled  Post assessment: Post-op Vital signs reviewed  Last Vitals:  Filed Vitals:   12/20/12 1221  BP: 135/63  Pulse: 62  Temp: 36.4 C  Resp: 18    Post vital signs: Reviewed  Level of consciousness: sedated  Complications: No apparent anesthesia complications

## 2012-12-20 NOTE — Transfer of Care (Signed)
Immediate Anesthesia Transfer of Care Note  Patient: Suzanne Russell  Procedure(s) Performed: Procedure(s): TOTAL VAGINAL HYSTERECTOMY (N/A) ANTERIOR (CYSTOCELE) AND POSTERIOR REPAIR (RECTOCELE) (N/A)  Patient Location: PACU  Anesthesia Type:General  Level of Consciousness: awake, alert  and oriented  Airway & Oxygen Therapy: Patient Spontanous Breathing and Patient connected to nasal cannula oxygen  Post-op Assessment: Report given to PACU RN, Post -op Vital signs reviewed and stable and Patient moving all extremities  Post vital signs: Reviewed and stable  Complications: No apparent anesthesia complications

## 2012-12-20 NOTE — Preoperative (Signed)
Beta Blockers   Reason not to administer Beta Blockers:Not Applicable 

## 2012-12-20 NOTE — Anesthesia Preprocedure Evaluation (Signed)
Anesthesia Evaluation  Patient identified by MRN, date of birth, ID band Patient awake    Reviewed: Allergy & Precautions, H&P , NPO status , Patient's Chart, lab work & pertinent test results  Airway Mallampati: II TM Distance: >3 FB Neck ROM: Full    Dental no notable dental hx. (+) Teeth Intact and Caps   Pulmonary neg pulmonary ROS,  breath sounds clear to auscultation  Pulmonary exam normal       Cardiovascular hypertension, Pt. on medications Rhythm:Regular Rate:Normal     Neuro/Psych  Headaches, Neuropathy  Neuromuscular disease negative psych ROS   GI/Hepatic Neg liver ROS, GERD-  Medicated and Controlled,  Endo/Other  negative endocrine ROS  Renal/GU negative Renal ROS  negative genitourinary   Musculoskeletal negative musculoskeletal ROS (+)   Abdominal   Peds  Hematology negative hematology ROS (+)   Anesthesia Other Findings   Reproductive/Obstetrics Uterine prolapse                           Anesthesia Physical Anesthesia Plan  ASA: II  Anesthesia Plan: General   Post-op Pain Management:    Induction: Intravenous  Airway Management Planned: Oral ETT  Additional Equipment:   Intra-op Plan:   Post-operative Plan: Extubation in OR  Informed Consent: I have reviewed the patients History and Physical, chart, labs and discussed the procedure including the risks, benefits and alternatives for the proposed anesthesia with the patient or authorized representative who has indicated his/her understanding and acceptance.   Dental advisory given  Plan Discussed with: CRNA, Anesthesiologist and Surgeon  Anesthesia Plan Comments:         Anesthesia Quick Evaluation

## 2012-12-20 NOTE — H&P (Signed)
  The patient was examined.  I reviewed the proposed surgery and consent form with the patient.  The dictated history and physical is current and accurate and all questions were answered. The patient is ready to proceed with surgery and has a realistic understanding and expectation for the outcome.   Dara Lords MD, 12:44 PM 12/20/2012

## 2012-12-20 NOTE — Op Note (Signed)
Suzanne Russell [birth year] [id number]   Post Operative Note   Date of surgery:  12/20/2012  Pre Op Dx:  Uterine prolapse, cystocele, rectocele  Post Op Dx:  Same  Procedure:  Total vaginal hysterectomy, anterior colporrhaphy, posterior colporrhaphy  Surgeon:  Dara Lords  Assistant:  Reynaldo Minium  Anesthesia:  General  EBL:  50 cc  Complications:  None  Specimen:  Uterus to pathology  Findings: EUA:  External BUS vagina with atrophic changes. Cervix to the level of the introitus. Anterior bulging consistent with her cystocele. Rectocele on digital rectal exam. Uterus normal size. Adnexa without masses   Operative:  Uterus small with several small intramural myomas palpated.  Right and left fallopian tubes grossly normal. Right and left ovaries postmenopausal small and atrophic. High in the pelvic sidewall, left undisturbed. Cul-de-sac grossly normal to limited inspection.  Procedure:  Patient was taken to the operating room, underwent general anesthesia, was placed in the dorsal lithotomy position, received a perineal/vaginal preparation with Betadine solution, indwelling Foley catheter placed and sterile technique an EUA was performed. A timeout was performed by the surgical team. The patient was draped in the usual fashion and the cervix was visualized with a weighted speculum, grasped with a single-tooth tenaculum and the cervical mucosa was circumferentially injected using lidocaine/epinephrine mixture a total of 10 cc. The cervical mucosa was then circumferentially incised and the paracervical planes sharply developed. The posterior cul-de-sac was then sharply entered without difficulty a long weighted speculum was placed. The right and left uterosacral ligaments were identified, clamped cut and ligated using 0 Vicryl suture and tagged for future reference. The anterior vesicouterine plane was sharply developed and the parametrial tissues bilaterally were clamped cut and  ligated using 0 Vicryl suture to further free the uterus. The uterine vessels were identified and ligated separately bilaterally using 0 Vicryl suture. To aid in the anterior bladder dissection a finger was passed around the uterus into the anterior cul-de-sac and with elevation the anterior cul-de-sac was entered without difficulty. The right and left utero-ovarian pedicles were clamped cut and doubly ligated using 0 Vicryl suture. The uterus was removed and sent to pathology. The intestines were packed from the cul-de-sac with a tagged tail sponge and inspection of the right and left ovaries and fallopian tubes segments showed normal appearing postmenopausal ovaries high on the sidewall making removal more difficult. As per my prior discussion with the patient she did not want to risk a separate abdominal incision for removal of normal-appearing ovaries and these were left intact. The posterior vaginal cuff was reapproximated from the uterosacral ligament to uterosacral ligament with 0 Vicryl suture in a running interlocking stitch. It was obvious that an anterior colporrhaphy and posterior colporrhaphy were necessary at this point the anterior vaginal cuff was grasped with Allis clamps vaginal mucosa was sharply incised caudal to cephalad through blunt and sharp development of the vesicovaginal mucosa plain with progressive application of Allis clamps. The bladder was freed from the vaginal mucosa with blunt and sharp dissection reducing the cystocele without difficulty. Using 2-0 Vicryl in interrupted imbricating stitches the cystocele was progressively reduced, the excess vaginal mucosa excised and the anterior vaginal mucosa reapproximated using 2-0 Vicryl suture in a running interlocking stitch incorporating the underlying tissues to close the dead space. The vaginal cuff was then reapproximated anterior to posterior using 0 Vicryl suture with interrupted figure-of-eight stitch. Attention was then turned to  the posterior colporrhaphy and the introital posterior fourchette was grasped laterally with Lavena Bullion  clamps and the intervening mucosa excised. The posterior vaginal mucosa was then sharply incised caudal to cephalad through blunt and sharp dissection with progressive application of Allis clamps. The underlying perirectal tissues were sharply and bluntly freed from the overlying vaginal mucosa reducing the rectocele. Using 2-0 Vicryl in interrupted imbricating stitches the rectocele was progressively reduced, the excess vaginal mucosa excised and the posterior vaginal mucosa was reapproximated using 2-0 Vicryl in a running interlocking stitch incorporating the underlying tissues to close the dead space. The vagina was irrigated with adequate hemostasis visualized. There appeared to be good reduction of her cystocele rectocele and support of the upper vaginal cuff and vaginal packing with Estrace cream was placed. The patient was placed in the supine position, awakened without difficulty, had clear free-flowing yellow urine in her Foley catheter and was taken to recovery room in good condition having tolerated the procedure well.    Dara Lords MD, 4:47 PM 12/20/2012

## 2012-12-21 ENCOUNTER — Encounter (HOSPITAL_COMMUNITY): Payer: Self-pay | Admitting: Gynecology

## 2012-12-21 LAB — CBC
Hemoglobin: 10.2 g/dL — ABNORMAL LOW (ref 12.0–15.0)
MCHC: 33 g/dL (ref 30.0–36.0)

## 2012-12-21 MED ORDER — OXYCODONE HCL 5 MG PO CAPS: 5.0000 mg | ORAL_CAPSULE | ORAL | Status: DC | PRN

## 2012-12-21 NOTE — Progress Notes (Addendum)
Patient ID: Suzanne Russell, female   DOB: 12/25/1935, 77 y.o.   MRN: [id number] Suzanne Russell 1936-06-21 [id number]   1 Day Post-Op Procedure(s) (LRB): TOTAL VAGINAL HYSTERECTOMY (N/A) ANTERIOR (CYSTOCELE) AND POSTERIOR REPAIR (RECTOCELE) (N/A)  Subjective: Patient reports feels well, no acute distress, pain severity reported mild, yes taking PO, foley catheter in place, no voiding, yes ambulating, yes passing flatus  Objective: Vital signs in last 24 hours: Temp:  [97.3 F (36.3 C)-98 F (36.7 C)] 98 F (36.7 C) (06/19 0543) Pulse Rate:  [59-79] 59 (06/19 0543) Resp:  [11-30] 16 (06/19 0543) BP: (106-147)/(48-71) 106/69 mmHg (06/19 0543) SpO2:  [93 %-100 %] 95 % (06/19 0543) Weight:  [160 lb (72.576 kg)] 160 lb (72.576 kg) (06/18 1856)      EXAM General: awake, alert and no distress Resp: rhonchi clear to auscultation bilaterally Cardio: regular rate and rhythm, S1, S2 normal, no murmur, click, rub or gallop GI: soft, non tender, bowel sounds active Lower Extremities: Without swelling or tenderness Vaginal Bleeding:  Vaginal packing removed.  No active bleeding   Lab Results:   Recent Labs  12/21/12 0550  WBC 11.9*  HGB 10.2*  HCT 30.9*  PLT 249    Assessment: s/p Procedure(s): TOTAL VAGINAL HYSTERECTOMY ANTERIOR (CYSTOCELE) AND POSTERIOR REPAIR (RECTOCELE): progressing well, ready for discharge.  Eating, ambulating with minimal pain.  Hb good.  Plan: Discharge home today after foley out and voiding.  Precautions, instructions and follow up were discussed with the patient.  Prescriptions provided per AVS.  Patient to call the office to arrange a post-operative appointmant in 2 weeks.  I gave prescription for oxycodone 5 mg.  Pt reports rash with hydrocodone.  Recommended benadryl 25 mg before oxycodone.  She is on tramadol 50 mg QID for her neuropathy.  Dara Lords, MD 12/21/2012 8:10 AM

## 2012-12-21 NOTE — Discharge Summary (Signed)
  Jelitza Manninen Marsch [birth year] [id number]   Discharge Summary  Date of Admission:  12/20/2012  Date of Discharge:  12/21/2012  Discharge Diagnosis:   1. Uterine prolapse 2. Cystocele 3. Rectocele 4. Leiomyoma 5. Adenomyosis  Procedure:  Procedure(s): TOTAL VAGINAL HYSTERECTOMY ANTERIOR (CYSTOCELE) AND POSTERIOR REPAIR (RECTOCELE)  Pathology: Uterus and cervix - BENIGN ENDOMETRIUM WITH EXTENSIVE UNDERLYING ADENOMYOSIS AND TWO LEIOMYOMATA MEASURING 1 CM AND 0.5 CM IN GREATEST DIMENSION. - BENIGN CERVIX. - NO ATYPIA, DYSPLASIA, HYPERPLASIA OR MALIGNANCY IDENTIFIED WITHIN THE SPECIMEN  Hospital Course:  The patient underwent an uncomplicated total vaginal hysterectomy, anterior colporrhaphy and posterior colporrhaphy 12/20/2012. The patient's postoperative course was uncomplicated she was discharged on postoperative day #1 after having her vaginal packing removed ambulating well tolerating a regular diet voiding without difficulty with good pain relief on oral medication. Postoperative hemoglobin 10 preoperative hemoglobin 11. The patient received instructions for postoperative care and call precautions.  She received prescriptions per AVS and will be seen in the office 2 weeks following discharge.       Dara Lords MD, 5:18 PM 12/21/2012

## 2012-12-21 NOTE — Anesthesia Postprocedure Evaluation (Signed)
  Anesthesia Post-op Note  Patient: Suzanne Russell  Procedure(s) Performed: Procedure(s): TOTAL VAGINAL HYSTERECTOMY (N/A) ANTERIOR (CYSTOCELE) AND POSTERIOR REPAIR (RECTOCELE) (N/A)  Patient Location: PACU and Women's Unit  Anesthesia Type:General  Level of Consciousness: awake, alert  and oriented  Airway and Oxygen Therapy: Patient Spontanous Breathing  Post-op Pain: mild  Post-op Assessment: Patient's Cardiovascular Status Stable, Respiratory Function Stable, No signs of Nausea or vomiting, Adequate PO intake and Pain level controlled  Post-op Vital Signs: stable  Complications: No apparent anesthesia complications

## 2012-12-22 ENCOUNTER — Telehealth: Payer: Self-pay

## 2012-12-22 NOTE — Telephone Encounter (Signed)
Patient called and asked me to relay message to you. She wanted to thank you for calling her last night. She said she was downstairs and phone was upstairs. She said it meant the world to her that you called. She said she is doing great.  She said to tell you she could not take the Oxycodone as it caused her face to break out right after taking it.  She said to tell you she does not need anything for pain that she is doing fine. (She said she takes Tramadol 4x daily regularly.)  She said she and her husband are very impressed with you as a physician and the care she has received from [place] all. I told her to call me if she needs anything. She does have her pre-op appt scheduled and said to tell you she will see you on July 2nd.

## 2012-12-23 ENCOUNTER — Other Ambulatory Visit: Payer: Self-pay | Admitting: Internal Medicine

## 2012-12-25 ENCOUNTER — Telehealth: Payer: Self-pay

## 2012-12-25 NOTE — Telephone Encounter (Signed)
I would just worried about drinking. She can try eating small amounts. The keeping herself hydrated as most important thing. Occasionally patients can get antibiotic-induced diarrhea such as clostridium but she really only received one dose of antibiotics and I doubt this is related. I would watch for now. If it does continue then I may have her do a Clostridium difficile check.

## 2012-12-25 NOTE — Telephone Encounter (Signed)
Patient called complaining of "intestinal bug". She said every time she eats she has diarrhea x 24 hours. No fever. No vomiting. She asked what you recommended?

## 2012-12-25 NOTE — Telephone Encounter (Signed)
Patient informed. 

## 2013-01-03 ENCOUNTER — Encounter: Payer: Self-pay | Admitting: Gynecology

## 2013-01-03 ENCOUNTER — Ambulatory Visit (INDEPENDENT_AMBULATORY_CARE_PROVIDER_SITE_OTHER): Payer: Medicare Other | Admitting: Gynecology

## 2013-01-03 DIAGNOSIS — Z9889 Other specified postprocedural states: Secondary | ICD-10-CM

## 2013-01-03 MED ORDER — ESTRADIOL 0.05 MG/24HR TD PTTW: 1.0000 | MEDICATED_PATCH | TRANSDERMAL | Status: DC

## 2013-01-03 NOTE — Progress Notes (Signed)
Patient presents for two-week postoperative visit status post TVH anterior-posterior colporrhaphy. She has done well without complaints. Voiding and having bowel movements without difficulty. I reviewed pathology with her which showed adenomyosis and leiomyoma.  Exam Kim assistant Abdomen soft nontender without masses guarding rebound organomegaly. Pelvic external BUS vagina with suture lines intact healing well. Tenderness at the knot on the external perineal body. Cuff intact Bimanual without masses or tenderness.  Assessment and plan: Normal exam status post TVH anterior-posterior colporrhaphy. She is tender where the knot is tied externally on the perineal body and I went ahead and clipped out the knot. Left remaining suture line in place. Patient will slowly resume activities with the exception of no heavy lifting or straining and continued pelvic rest. Represent in 2 weeks for her next postoperative visit. The patient did note significant hot flashes and night sweats. Had been on ERT prior to surgery and asked about reinitiating it.  I reviewed the whole issue of HRT with her to include the WHI study with increased risk of stroke, heart attack, DVT and breast cancer. The ACOG and NAMS statements for lowest dose for the shortest period of time reviewed. Transdermal versus oral first-pass effect benefit discussed.  Using HRT with advancing age also discussed. After lengthy discussion patient wants to reinitiate and I gave her samples of Minivelle 0.05 and refilled x1 year.

## 2013-01-03 NOTE — Patient Instructions (Signed)
Followup in 2 weeks for her next postoperative visit.

## 2013-01-17 ENCOUNTER — Encounter: Payer: Self-pay | Admitting: Gynecology

## 2013-01-17 ENCOUNTER — Ambulatory Visit (INDEPENDENT_AMBULATORY_CARE_PROVIDER_SITE_OTHER): Payer: Medicare Other | Admitting: Gynecology

## 2013-01-17 DIAGNOSIS — Z9889 Other specified postprocedural states: Secondary | ICD-10-CM

## 2013-01-17 NOTE — Progress Notes (Signed)
Patient presents for postoperative visit status post [facility] anterior-posterior colporrhaphy. Has done well without complaints. Voiding and having bowel movements without difficulty.  Exam with Berenice Bouton External BUS vagina suture line is healing nicely. Remaining perineal sutures removed. Bimanual exam without masses or tenderness.  Assessment and plan: Normal postoperative check status post TVH anterior/posterior colporrhaphy. Slowly resume all normal activities with continued pelvic rest for another 2 weeks. Followup in 6 months for annual exam

## 2013-01-17 NOTE — Patient Instructions (Signed)
Slowly resume all normal activities with the exception of nothing inside the vagina for another 2 weeks. Comment if you have any issues, otherwise followup in January when you're due for your annual followup.

## 2013-02-05 ENCOUNTER — Other Ambulatory Visit: Payer: Self-pay | Admitting: Internal Medicine

## 2013-03-16 ENCOUNTER — Other Ambulatory Visit: Payer: Self-pay | Admitting: *Deleted

## 2013-03-16 MED ORDER — TRAMADOL HCL 50 MG PO TABS: ORAL_TABLET | ORAL | Status: DC

## 2013-03-28 ENCOUNTER — Other Ambulatory Visit: Payer: Self-pay | Admitting: Internal Medicine

## 2013-05-10 ENCOUNTER — Other Ambulatory Visit: Payer: Self-pay

## 2013-05-16 ENCOUNTER — Other Ambulatory Visit: Payer: Self-pay | Admitting: Gynecology

## 2013-05-18 ENCOUNTER — Telehealth: Payer: Self-pay | Admitting: *Deleted

## 2013-05-18 NOTE — Telephone Encounter (Signed)
[employer] a RX request to [facility] for Trimethoprim. I Left message for pt to call back regarding it.  SInce he didn't prescribe it. KW

## 2013-05-21 ENCOUNTER — Other Ambulatory Visit: Payer: Self-pay | Admitting: *Deleted

## 2013-05-21 MED ORDER — TRAMADOL HCL 50 MG PO TABS: ORAL_TABLET | ORAL | Status: DC

## 2013-05-21 NOTE — Telephone Encounter (Signed)
Pt hasnt called back. KW

## 2013-05-28 ENCOUNTER — Encounter: Payer: Self-pay | Admitting: Internal Medicine

## 2013-06-04 ENCOUNTER — Telehealth: Payer: Self-pay | Admitting: Internal Medicine

## 2013-06-04 MED ORDER — ZOSTER VACCINE LIVE 19400 UNT/0.65ML ~~LOC~~ SOLR: 0.6500 mL | Freq: Once | SUBCUTANEOUS | Status: DC

## 2013-06-04 NOTE — Telephone Encounter (Signed)
Pt notified Rx for Shingles vaccine sent to pharmacy. 

## 2013-06-04 NOTE — Telephone Encounter (Signed)
Pt would like rx for the shingles vac sent to [facility].

## 2013-06-20 ENCOUNTER — Encounter: Payer: Self-pay | Admitting: Gynecology

## 2013-06-20 LAB — HM MAMMOGRAPHY: HM Mammogram: NEGATIVE

## 2013-06-21 ENCOUNTER — Encounter: Payer: Self-pay | Admitting: Internal Medicine

## 2013-06-25 ENCOUNTER — Other Ambulatory Visit: Payer: Self-pay | Admitting: Internal Medicine

## 2013-06-27 ENCOUNTER — Encounter: Payer: Self-pay | Admitting: Internal Medicine

## 2013-07-10 ENCOUNTER — Encounter: Payer: Self-pay | Admitting: Gynecology

## 2013-07-16 ENCOUNTER — Encounter: Payer: Medicare Other | Admitting: Gynecology

## 2013-07-25 ENCOUNTER — Telehealth: Payer: Self-pay | Admitting: Internal Medicine

## 2013-07-25 MED ORDER — TRAMADOL HCL 50 MG PO TABS: ORAL_TABLET | ORAL | Status: DC

## 2013-07-25 NOTE — Telephone Encounter (Signed)
Pt notified Rx for Tramadol called into pharmacy.

## 2013-07-25 NOTE — Telephone Encounter (Signed)
Pt needs re-fill on traMADol (ULTRAM) 50 MG tablet.  I advised her that she had re-fills on it and she said she will call the pharmacy back.

## 2013-07-25 NOTE — Telephone Encounter (Signed)
Pt does not have any refills on tramadol. Please call rx into phar today

## 2013-08-23 ENCOUNTER — Encounter: Payer: Self-pay | Admitting: Gynecology

## 2013-08-23 ENCOUNTER — Other Ambulatory Visit: Payer: Self-pay | Admitting: Internal Medicine

## 2013-08-24 ENCOUNTER — Telehealth: Payer: Self-pay | Admitting: *Deleted

## 2013-08-24 NOTE — Telephone Encounter (Signed)
Pt medication for minivelle patch 0.05 mg is requiring prior authorization this form was filled out and faxed to [facility], will wait for response.

## 2013-08-27 NOTE — Telephone Encounter (Signed)
minivelle patch approved through 08/24/2014

## 2013-09-03 ENCOUNTER — Encounter: Payer: Self-pay | Admitting: Gynecology

## 2013-09-11 ENCOUNTER — Encounter: Payer: Self-pay | Admitting: Gynecology

## 2013-09-18 ENCOUNTER — Other Ambulatory Visit: Payer: Self-pay | Admitting: Internal Medicine

## 2013-09-18 ENCOUNTER — Encounter: Payer: Self-pay | Admitting: Gynecology

## 2013-09-18 ENCOUNTER — Ambulatory Visit (INDEPENDENT_AMBULATORY_CARE_PROVIDER_SITE_OTHER): Payer: Medicare Other | Admitting: Gynecology

## 2013-09-18 VITALS — BP 120/74 | Ht 61.0 in | Wt 173.0 lb

## 2013-09-18 DIAGNOSIS — Z79890 Hormone replacement therapy (postmenopausal): Secondary | ICD-10-CM

## 2013-09-18 DIAGNOSIS — N393 Stress incontinence (female) (male): Secondary | ICD-10-CM

## 2013-09-18 DIAGNOSIS — N952 Postmenopausal atrophic vaginitis: Secondary | ICD-10-CM

## 2013-09-18 MED ORDER — ESTRADIOL 0.05 MG/24HR TD PTTW: 1.0000 | MEDICATED_PATCH | TRANSDERMAL | Status: DC

## 2013-09-18 NOTE — Patient Instructions (Signed)
Try the new patch as we discussed. Call me if you have any issues with this. Followup in one year for annual exam.

## 2013-09-18 NOTE — Progress Notes (Signed)
Suzanne Russell 1935-10-06 [id number]        78 y.o.  [id number] for followup exam.  Several issues as below.  Past medical history,surgical history, problem list, medications, allergies, family history and social history were all reviewed and documented in the EPIC chart.  ROS:  Performed and pertinent positives and negatives are included in the history, assessment and plan .  Exam: Kim assistant Filed Vitals:   09/18/13 0902  BP: 120/74  Height: 5\' 1"  (1.549 m)  Weight: 173 lb (78.472 kg)   General appearance  Normal Skin grossly normal Head/Neck normal with no cervical or supraclavicular adenopathy thyroid normal Lungs  clear Cardiac RR, without RMG Abdominal  soft, nontender, without masses, organomegaly or hernia Breasts  examined lying and sitting without masses, retractions, discharge or axillary adenopathy. Pelvic  Ext/BUS/vagina with generalized atrophic changes.  Adnexa  Without masses or tenderness    Anus and perineum  Normal   Rectovaginal  Normal sphincter tone without palpated masses or tenderness.    Assessment/Plan:  78 y.o. [id number] female for annual exam.   78. Postmenopausal/HRT/atrophic genital changes.  Patient continues on Minivelle 0.05 mg patches. Doing well with this. She has tried weaning and has had unacceptable hot flashes and just didn't feel well. Does note the day before she is due to change her patch she has some headaches.  I again reviewed the whole issue of HRT with her to include the WHI study with increased risk of stroke, heart attack, DVT and breast cancer. The ACOG and NAMS statements for lowest dose for the shortest period of time reviewed. Transdermal versus oral first-pass effect benefit discussed.  The issues of continuing HRT particularly in the 76s discussed. Patient understands the issues and wants to continue. I did suggest switching to regular Vivelle to see if it doesn't eliminate her headaches. She'll call me if she continues to have  issues and I refilled her x1 year Vivelle 0.05 mg patches 2. Urinary incontinence. Patient has stress-like symptoms with slight loss of urine. No urgency symptoms. Options for management include surgery reviewed. Patient is not interested in surgery but would prefer observation. Kegel exercise options also reviewed. Check urinalysis. Followup if wants to consider surgery and I will refer to urology. 3. Pap smear 2013. No Pap smear done today. No history of significant abnormal Pap smears. Status post hysterectomy for benign indications. At the age of 62. We'll plan on stop screening per current screening guidelines. 4. Mammography 06/2013. Continue with annual mammography. SBE monthly review. 5. DEXA 5-6 years ago. Plan repeat DEXA now. Increase calcium and vitamin D recommendations reviewed. 6. Colonoscopy 2014. Repeat at their recommended interval. 7. Health maintenance. No blood work done as it is all done through her primary physician's office. Followup for DEXA otherwise annually.   Note: This document was prepared with digital dictation and possible smart phrase technology. Any transcriptional errors that result from this process are unintentional.   Anastasio Auerbach MD, 9:26 AM 09/18/2013

## 2013-09-19 ENCOUNTER — Other Ambulatory Visit: Payer: Self-pay | Admitting: *Deleted

## 2013-09-19 ENCOUNTER — Encounter: Payer: Self-pay | Admitting: Internal Medicine

## 2013-09-19 LAB — URINALYSIS W MICROSCOPIC + REFLEX CULTURE
BACTERIA UA: NONE SEEN
BILIRUBIN URINE: NEGATIVE
CASTS: NONE SEEN
CRYSTALS: NONE SEEN
GLUCOSE, UA: NEGATIVE mg/dL
Hgb urine dipstick: NEGATIVE
KETONES UR: NEGATIVE mg/dL
Leukocytes, UA: NEGATIVE
Nitrite: NEGATIVE
Protein, ur: NEGATIVE mg/dL
SPECIFIC GRAVITY, URINE: 1.013 (ref 1.005–1.030)
UROBILINOGEN UA: 0.2 mg/dL (ref 0.0–1.0)
pH: 5.5 (ref 5.0–8.0)

## 2013-09-19 MED ORDER — GABAPENTIN 800 MG PO TABS: ORAL_TABLET | ORAL | Status: DC

## 2013-09-19 NOTE — Telephone Encounter (Signed)
Pt is going out of town today at noon.

## 2013-10-01 ENCOUNTER — Other Ambulatory Visit: Payer: Self-pay | Admitting: Gynecology

## 2013-10-01 DIAGNOSIS — Z780 Asymptomatic menopausal state: Secondary | ICD-10-CM

## 2013-10-02 ENCOUNTER — Telehealth: Payer: Self-pay | Admitting: Internal Medicine

## 2013-10-02 MED ORDER — TRAMADOL HCL 50 MG PO TABS: ORAL_TABLET | ORAL | Status: DC

## 2013-10-02 NOTE — Telephone Encounter (Signed)
Pt request refill of traMADol (ULTRAM) 50 MG tablet Pt not seen since 09/15/12.  appt scheduled 4/28  Can you refill until then? [place]

## 2013-10-02 NOTE — Telephone Encounter (Signed)
Rx called into pharmacy and they will contact pt to let her know refill ready.

## 2013-10-05 ENCOUNTER — Other Ambulatory Visit: Payer: Self-pay | Admitting: Internal Medicine

## 2013-10-09 ENCOUNTER — Encounter: Payer: Self-pay | Admitting: Internal Medicine

## 2013-10-09 MED ORDER — GABAPENTIN 800 MG PO TABS: ORAL_TABLET | ORAL | Status: DC

## 2013-10-09 NOTE — Telephone Encounter (Signed)
[place] and spoke to pharmacist they can not get manufacturer she likes. Told pharmacist okay will give verbal order for Neurontin DAW. Pharmacist said they will let her know Rx is there when she needs it.

## 2013-10-16 ENCOUNTER — Ambulatory Visit (INDEPENDENT_AMBULATORY_CARE_PROVIDER_SITE_OTHER): Payer: Medicare Other

## 2013-10-16 ENCOUNTER — Other Ambulatory Visit: Payer: Self-pay | Admitting: Gynecology

## 2013-10-16 DIAGNOSIS — N958 Other specified menopausal and perimenopausal disorders: Secondary | ICD-10-CM

## 2013-10-16 DIAGNOSIS — N959 Unspecified menopausal and perimenopausal disorder: Secondary | ICD-10-CM

## 2013-10-16 DIAGNOSIS — N952 Postmenopausal atrophic vaginitis: Secondary | ICD-10-CM

## 2013-10-16 DIAGNOSIS — Z780 Asymptomatic menopausal state: Secondary | ICD-10-CM

## 2013-10-17 ENCOUNTER — Encounter: Payer: Self-pay | Admitting: Gynecology

## 2013-10-17 ENCOUNTER — Other Ambulatory Visit: Payer: Self-pay | Admitting: Gynecology

## 2013-10-17 DIAGNOSIS — N952 Postmenopausal atrophic vaginitis: Secondary | ICD-10-CM

## 2013-10-17 DIAGNOSIS — N959 Unspecified menopausal and perimenopausal disorder: Secondary | ICD-10-CM

## 2013-10-17 NOTE — Telephone Encounter (Signed)
I would referred to a urologist who does incontinence surgery all the time such as Dr. Bernerd Limbo

## 2013-10-23 ENCOUNTER — Encounter: Payer: Self-pay | Admitting: Internal Medicine

## 2013-10-24 ENCOUNTER — Other Ambulatory Visit: Payer: Self-pay | Admitting: *Deleted

## 2013-10-24 MED ORDER — TRAMADOL HCL 50 MG PO TABS: ORAL_TABLET | ORAL | Status: DC

## 2013-10-24 NOTE — Telephone Encounter (Signed)
Left message on voicemail to call office. Pt needs to schedule physcial, Rx for 90 tablets called into pharmacy. No more refills till seen.

## 2013-10-24 NOTE — Telephone Encounter (Signed)
Sent MyChart message also

## 2013-10-30 ENCOUNTER — Other Ambulatory Visit: Payer: Self-pay | Admitting: *Deleted

## 2013-10-30 ENCOUNTER — Ambulatory Visit: Payer: Medicare Other | Admitting: Internal Medicine

## 2013-10-30 MED ORDER — AMLODIPINE BESYLATE 2.5 MG PO TABS: 2.5000 mg | ORAL_TABLET | Freq: Every day | ORAL | Status: DC

## 2013-10-30 MED ORDER — TRAMADOL HCL 50 MG PO TABS: ORAL_TABLET | ORAL | Status: DC

## 2013-10-30 NOTE — Telephone Encounter (Signed)
Pt aware refills done, enough till appointment in June.

## 2013-11-01 NOTE — Progress Notes (Signed)
   Subjective:    Patient ID: Suzanne Russell, female    DOB: 1936-01-03, 78 y.o.   MRN: [id number]  HPI    Review of Systems     Objective:   Physical Exam        Assessment & Plan:  Pt not seen

## 2013-11-12 ENCOUNTER — Other Ambulatory Visit: Payer: Self-pay | Admitting: Gynecology

## 2013-11-15 ENCOUNTER — Encounter: Payer: Self-pay | Admitting: Gynecology

## 2013-11-15 MED ORDER — TRIMETHOPRIM 100 MG PO TABS: 100.0000 mg | ORAL_TABLET | Freq: Every day | ORAL | Status: DC | PRN

## 2013-11-15 NOTE — Telephone Encounter (Signed)
Okay for trimethoprim 100 mg #30 one by mouth when necessary UTI symptoms. One refill

## 2013-11-19 ENCOUNTER — Other Ambulatory Visit: Payer: Self-pay | Admitting: Internal Medicine

## 2013-12-19 ENCOUNTER — Other Ambulatory Visit (INDEPENDENT_AMBULATORY_CARE_PROVIDER_SITE_OTHER): Payer: Medicare Other

## 2013-12-19 DIAGNOSIS — Z0000 Encounter for general adult medical examination without abnormal findings: Secondary | ICD-10-CM

## 2013-12-19 DIAGNOSIS — I10 Essential (primary) hypertension: Secondary | ICD-10-CM

## 2013-12-19 LAB — CBC WITH DIFFERENTIAL/PLATELET
Basophils Absolute: 0 10*3/uL (ref 0.0–0.1)
Basophils Relative: 0.5 % (ref 0.0–3.0)
EOS PCT: 1.4 % (ref 0.0–5.0)
Eosinophils Absolute: 0.1 10*3/uL (ref 0.0–0.7)
HCT: 34 % — ABNORMAL LOW (ref 36.0–46.0)
Hemoglobin: 11.4 g/dL — ABNORMAL LOW (ref 12.0–15.0)
Lymphocytes Relative: 34.2 % (ref 12.0–46.0)
Lymphs Abs: 2.4 10*3/uL (ref 0.7–4.0)
MCHC: 33.4 g/dL (ref 30.0–36.0)
MCV: 92.4 fl (ref 78.0–100.0)
MONOS PCT: 8 % (ref 3.0–12.0)
Monocytes Absolute: 0.6 10*3/uL (ref 0.1–1.0)
NEUTROS PCT: 55.9 % (ref 43.0–77.0)
Neutro Abs: 3.9 10*3/uL (ref 1.4–7.7)
Platelets: 305 10*3/uL (ref 150.0–400.0)
RBC: 3.68 Mil/uL — ABNORMAL LOW (ref 3.87–5.11)
RDW: 14.4 % (ref 11.5–15.5)
WBC: 7 10*3/uL (ref 4.0–10.5)

## 2013-12-19 LAB — HEPATIC FUNCTION PANEL
ALT: 16 U/L (ref 0–35)
AST: 20 U/L (ref 0–37)
Albumin: 4.1 g/dL (ref 3.5–5.2)
Alkaline Phosphatase: 47 U/L (ref 39–117)
BILIRUBIN TOTAL: 0.4 mg/dL (ref 0.2–1.2)
Bilirubin, Direct: 0 mg/dL (ref 0.0–0.3)
Total Protein: 7.2 g/dL (ref 6.0–8.3)

## 2013-12-19 LAB — POCT URINALYSIS DIPSTICK
BILIRUBIN UA: NEGATIVE
Glucose, UA: NEGATIVE
KETONES UA: NEGATIVE
LEUKOCYTES UA: NEGATIVE
Nitrite, UA: NEGATIVE
PH UA: 7
Protein, UA: NEGATIVE
SPEC GRAV UA: 1.015
Urobilinogen, UA: 0.2

## 2013-12-19 LAB — TSH: TSH: 2.36 u[IU]/mL (ref 0.35–4.50)

## 2013-12-19 LAB — LIPID PANEL
CHOLESTEROL: 223 mg/dL — AB (ref 0–200)
HDL: 56.9 mg/dL (ref >39.00)
LDL CALC: 151 mg/dL — AB (ref 0–99)
NonHDL: 166.1
Total CHOL/HDL Ratio: 4
Triglycerides: 77 mg/dL (ref 0.0–149.0)
VLDL: 15.4 mg/dL (ref 0.0–40.0)

## 2013-12-19 LAB — BASIC METABOLIC PANEL
BUN: 16 mg/dL (ref 6–23)
CO2: 30 mEq/L (ref 19–32)
Calcium: 9.5 mg/dL (ref 8.4–10.5)
Chloride: 97 mEq/L (ref 96–112)
Creatinine, Ser: 0.8 mg/dL (ref 0.4–1.2)
GFR: 78.66 mL/min (ref >60.00)
Glucose, Bld: 82 mg/dL (ref 70–99)
POTASSIUM: 4.4 meq/L (ref 3.5–5.1)
Sodium: 133 mEq/L — ABNORMAL LOW (ref 135–145)

## 2013-12-27 ENCOUNTER — Ambulatory Visit (INDEPENDENT_AMBULATORY_CARE_PROVIDER_SITE_OTHER): Payer: Medicare Other | Admitting: Internal Medicine

## 2013-12-27 ENCOUNTER — Encounter: Payer: Self-pay | Admitting: Internal Medicine

## 2013-12-27 VITALS — BP 120/70 | HR 72 | Temp 98.0°F | Resp 20 | Ht 60.5 in | Wt 174.0 lb

## 2013-12-27 DIAGNOSIS — G629 Polyneuropathy, unspecified: Secondary | ICD-10-CM

## 2013-12-27 DIAGNOSIS — G589 Mononeuropathy, unspecified: Secondary | ICD-10-CM

## 2013-12-27 DIAGNOSIS — Z23 Encounter for immunization: Secondary | ICD-10-CM

## 2013-12-27 DIAGNOSIS — R209 Unspecified disturbances of skin sensation: Secondary | ICD-10-CM

## 2013-12-27 DIAGNOSIS — I10 Essential (primary) hypertension: Secondary | ICD-10-CM

## 2013-12-27 DIAGNOSIS — Z0000 Encounter for general adult medical examination without abnormal findings: Secondary | ICD-10-CM

## 2013-12-27 NOTE — Progress Notes (Signed)
Subjective:    Patient ID: Renaldo Harrison, female    DOB: 1936/05/22, 78 y.o.   MRN: [id number]  HPI  Wt Readings from Last 3 Encounters:  12/27/13 174 lb (78.926 kg)  09/18/13 173 lb (78.472 kg)  12/20/12 160 lb (72.576 kg)     Subjective:    Patient ID: Renaldo Harrison, female    DOB: [birth year], 78 y.o.   MRN: [id number]  HPI  78 year-old patient who is seen today for a preventive health examination.  She's had recent orthopedic surgery for surgical removal of both second toes. She has done quite well. She's had a recent gynecologic evaluation and has had a hysterectomy in 2014. Prior to her orthopedic issues she was walking 1 hour daily without difficulties.  Her chief complaint today is weight gain.  She has been on high-dose Neurontin due to neuropathic pain.  She has treated hypertension, which has done well.  Past Medical History  Diagnosis Date  . Heart palpitations   . Shingles   . Pedal edema   . HA (headache)   . Hypertension   . Chronic cough   . Neuropathy   . Neuromuscular disorder     nerve damage after shingles  . GERD (gastroesophageal reflux disease)   . Urinary incontinence     History   Social History  . Marital Status: Married    Spouse Name: N/A    Number of Children: 2  . Years of Education: N/A   Occupational History  . retired Pharmacist, hospital    Social History Main Topics  . Smoking status: Never Smoker   . Smokeless tobacco: Never Used  . Alcohol Use: No  . Drug Use: No  . Sexual Activity: No   Other Topics Concern  . Not on file   Social History Narrative  . No narrative on file    Past Surgical History  Procedure Laterality Date  . Tonsillectomy    . Colonoscopy  2005  . Toe amputation      X 2  . Myomectomy vaginal approach  2012  . Vaginal hysterectomy N/A 12/20/2012    Procedure: TOTAL VAGINAL HYSTERECTOMY;  Surgeon: Anastasio Auerbach, MD;  Location: [facility];  Service: Gynecology;  Laterality: N/A;  . Anterior and  posterior repair N/A 12/20/2012    Procedure: ANTERIOR (CYSTOCELE) AND POSTERIOR REPAIR (RECTOCELE);  Surgeon: Anastasio Auerbach, MD;  Location: [facility];  Service: Gynecology;  Laterality: N/A;    Family History  Problem Relation Age of Onset  . COPD Father   . Coronary artery disease Father   . Breast cancer Maternal Aunt 24  . Hypertension Brother   . Breast cancer Maternal Aunt 45    Allergies  Allergen Reactions  . Naproxen     Tongue swelling  . Hydrocodone Rash  . Restasis [Cyclosporine] Other (See Comments)    Eyes real red and  burn  . Ciprofloxacin     Burning  . Oxycodone Rash    Current Outpatient Prescriptions on File Prior to Visit  Medication Sig Dispense Refill  . amLODipine (NORVASC) 2.5 MG tablet Take 1 tablet (2.5 mg total) by mouth daily.  90 tablet  0  . aspirin 81 MG EC tablet Take 81 mg by mouth daily.        . Calcium Carbonate (CALTRATE 600) 1500 MG TABS Take 600 mg by mouth daily.       . cloNIDine (CATAPRES) 0.1 MG tablet TAKE 1 TABLET EVERY 6  HOURS IF SYSTOLIC BLOOD PRESSURE IS GREATER THAN 170.  90 tablet  0  . estradiol (VIVELLE-DOT) 0.05 MG/24HR patch Place 1 patch (0.05 mg total) onto the skin 2 (two) times a week.  8 patch  12  . furosemide (LASIX) 40 MG tablet TAKE 1 TABLET DAILY.  90 tablet  0  . gabapentin (NEURONTIN) 800 MG tablet TAKE 1 TABLET EVERY 4 HOURS AS NEEDED,NOT TO EXCEED 5 PER DAY  150 tablet  2  . MICARDIS 80 MG tablet TAKE 1 TABLET ONCE DAILY.  90 tablet  1  . Multiple Vitamin (MULTIVITAMIN) tablet Take 1 tablet by mouth every other day.       Marland Kitchen omeprazole (PRILOSEC) 40 MG capsule       . traMADol (ULTRAM) 50 MG tablet TAKE (1) TABLET EVERY SIX HOURS AS NEEDED FOR PAIN.  90 tablet  3  . trimethoprim (TRIMPEX) 100 MG tablet Take 1 tablet (100 mg total) by mouth daily as needed.  30 tablet  1   No current facility-administered medications on file prior to visit.    BP 120/70  Pulse 72  Temp(Src) 98 F (36.7 C) (Oral)  Resp  20  Ht 5' 0.5" (1.537 m)  Wt 174 lb (78.926 kg)  BMI 33.41 kg/m2  SpO2 96%   1. Risk factors, based on past  M,S,F history-  cardiovascular risk factors include a history of hypertension  2.  Physical activities:  Limited only by orthopedic issues denies any exertional chest pain or exertional dyspnea on exertion  3.  Depression/mood: No history depression or mood disorder  4.  Hearing: No deficits  5.  ADL's: Independent in all aspects of daily living  6.  Fall risk: Low  7.  Home safety: No problems identified  8.  Height weight, and visual acuity; height and weight stable no change in visual acuity. Does obtain annual ophthalmology evaluation  9.  Counseling: Heart healthy diet reck her exercise moderate weight loss all encouraged  10. Lab orders based on risk factors: Laboratory update will be reviewed 11. Referral : Followup OB/GYN  12. Care plan: Plan is for elective vaginal hysterectomy. OB/GYN has scheduled a followup DEXA study  13. Cognitive assessment: Alert and oriented normal affect. No cognitive dysfunction       Review of Systems  Constitutional: Negative for fever, appetite change, fatigue and unexpected weight change.  HENT: Negative for hearing loss, ear pain, nosebleeds, congestion, sore throat, mouth sores, trouble swallowing, neck stiffness, dental problem, voice change, sinus pressure and tinnitus.   Eyes: Negative for photophobia, pain, redness and visual disturbance.  Respiratory: Negative for cough, chest tightness and shortness of breath.   Cardiovascular: Negative for chest pain, palpitations and leg swelling.  Gastrointestinal: Negative for nausea, vomiting, abdominal pain, diarrhea, constipation, blood in stool, abdominal distention and rectal pain.  Genitourinary: Negative for dysuria, urgency, frequency, hematuria, flank pain, vaginal bleeding, vaginal discharge, difficulty urinating, genital sores, vaginal pain, menstrual problem and pelvic  pain.  Musculoskeletal: Negative for back pain and arthralgias.  Skin: Negative for rash.  Neurological: Positive for numbness. Negative for dizziness, syncope, speech difficulty, weakness, light-headedness and headaches.  Hematological: Negative for adenopathy. Does not bruise/bleed easily.  Psychiatric/Behavioral: Negative for suicidal ideas, behavioral problems, self-injury, dysphoric mood and agitation. The patient is not nervous/anxious.        Objective:   Physical Exam  Constitutional: She is oriented to person, place, and time. She appears well-developed and well-nourished.  HENT:  Head: Normocephalic and  atraumatic.  Right Ear: External ear normal.  Left Ear: External ear normal.  Mouth/Throat: Oropharynx is clear and moist.  Eyes: Conjunctivae and EOM are normal.  Neck: Normal range of motion. Neck supple. No JVD present. No thyromegaly present.  Cardiovascular: Normal rate, regular rhythm, normal heart sounds and intact distal pulses.   No murmur heard. Pulmonary/Chest: Effort normal and breath sounds normal. She has no wheezes. She has no rales.  Abdominal: Soft. Bowel sounds are normal. She exhibits no distension and no mass. There is no tenderness. There is no rebound and no guarding.  Musculoskeletal: Normal range of motion. She exhibits no edema and no tenderness.  Hallux valgus deformities with bilateral second toe amputations  Neurological: She is alert and oriented to person, place, and time. She has normal reflexes. No cranial nerve deficit. She exhibits normal muscle tone. Coordination normal.  Skin: Skin is warm and dry. No rash noted.  Psychiatric: She has a normal mood and affect. Her behavior is normal.          Assessment & Plan:    Preventive health examination Hypertension stable History of peripheral neuropathy  Continue present regimen Weight loss encouraged Continue home blood pressure monitoring Recheck 6 months  Review of Systems       Objective:   Physical Exam        Assessment & Plan:

## 2013-12-27 NOTE — Patient Instructions (Signed)
Limit your sodium (Salt) intake  Please check your blood pressure on a regular basis.  If it is consistently greater than 150/90, please make an office appointment.    It is important that you exercise regularly, at least 20 minutes 3 to 4 times per week.  If you develop chest pain or shortness of breath seek  medical attention.  Return in 6 months for follow-up  

## 2013-12-27 NOTE — Progress Notes (Signed)
Pre-visit discussion using our clinic review tool. No additional management support is needed unless otherwise documented below in the visit note.  

## 2013-12-28 ENCOUNTER — Telehealth: Payer: Self-pay | Admitting: Internal Medicine

## 2013-12-28 NOTE — Telephone Encounter (Signed)
Relevant patient education assigned to patient using Emmi. ° °

## 2013-12-31 ENCOUNTER — Encounter: Payer: Self-pay | Admitting: Internal Medicine

## 2014-01-01 NOTE — Telephone Encounter (Signed)
FYI

## 2014-01-07 ENCOUNTER — Telehealth: Payer: Self-pay | Admitting: Internal Medicine

## 2014-01-07 ENCOUNTER — Encounter: Payer: Self-pay | Admitting: Internal Medicine

## 2014-01-07 MED ORDER — GABAPENTIN 800 MG PO TABS: ORAL_TABLET | ORAL | Status: DC

## 2014-01-07 NOTE — Telephone Encounter (Signed)
[facility] [place] [place], [address]. Is requesting re-fill on gabapentin (NEURONTIN) 800 MG tablet

## 2014-01-07 NOTE — Telephone Encounter (Signed)
Rx sent to pharmacy   

## 2014-02-01 ENCOUNTER — Other Ambulatory Visit: Payer: Self-pay | Admitting: Internal Medicine

## 2014-02-11 ENCOUNTER — Encounter: Payer: Self-pay | Admitting: Internal Medicine

## 2014-02-11 ENCOUNTER — Other Ambulatory Visit: Payer: Self-pay | Admitting: Internal Medicine

## 2014-02-12 ENCOUNTER — Other Ambulatory Visit: Payer: Self-pay | Admitting: Internal Medicine

## 2014-02-12 ENCOUNTER — Encounter: Payer: Self-pay | Admitting: Internal Medicine

## 2014-02-12 NOTE — Telephone Encounter (Signed)
I spoke to the pt.  Has not taken this medication since Jan. 2015.  Has recently been on tramadol and gabapentin.  She stated this is not helping her neuropathy.  Would like to restart the Cymbalta or have Dr. Raliegh Ip start new medication.  Please advise.  Thanks!

## 2014-02-12 NOTE — Telephone Encounter (Signed)
Pt notified Rx for Cymbalta sent to pharmacy and Tramadol was called in.

## 2014-02-12 NOTE — Telephone Encounter (Signed)
ok 

## 2014-02-12 NOTE — Telephone Encounter (Signed)
Waiting to see if Dr. Raliegh Ip is going to refill Cymbalta.

## 2014-02-13 ENCOUNTER — Encounter: Payer: Self-pay | Admitting: Internal Medicine

## 2014-03-04 ENCOUNTER — Other Ambulatory Visit: Payer: Self-pay | Admitting: Internal Medicine

## 2014-04-07 ENCOUNTER — Other Ambulatory Visit: Payer: Self-pay | Admitting: Internal Medicine

## 2014-04-08 ENCOUNTER — Ambulatory Visit (INDEPENDENT_AMBULATORY_CARE_PROVIDER_SITE_OTHER): Payer: Medicare Other | Admitting: *Deleted

## 2014-04-08 ENCOUNTER — Ambulatory Visit: Payer: Medicare Other | Admitting: Internal Medicine

## 2014-04-08 DIAGNOSIS — Z23 Encounter for immunization: Secondary | ICD-10-CM

## 2014-04-16 ENCOUNTER — Other Ambulatory Visit: Payer: Self-pay | Admitting: Internal Medicine

## 2014-04-22 ENCOUNTER — Other Ambulatory Visit: Payer: Self-pay | Admitting: Internal Medicine

## 2014-05-06 ENCOUNTER — Other Ambulatory Visit: Payer: Self-pay | Admitting: Internal Medicine

## 2014-05-06 ENCOUNTER — Encounter: Payer: Self-pay | Admitting: Internal Medicine

## 2014-06-24 ENCOUNTER — Other Ambulatory Visit: Payer: Self-pay | Admitting: Internal Medicine

## 2014-07-15 ENCOUNTER — Other Ambulatory Visit: Payer: Self-pay

## 2014-07-15 MED ORDER — TRIMETHOPRIM 100 MG PO TABS: 100.0000 mg | ORAL_TABLET | Freq: Every day | ORAL | Status: DC | PRN

## 2014-07-15 NOTE — Telephone Encounter (Signed)
Note from 11/15/13 when patient last received Rx. " HI, KATHY, I REQUESTED A REFILL FOR TRIMETHOPRIM 100MG  AND WAS TOLD TO CONTACT DR. FONTAINE'S OFFICE. YEARS AGO DR. Ubaldo Glassing PRESCRIBED THIS FOR ME BECAUSE I HAD SO MANY UTI'S. IT HAS SAVED ME MANY TRIPS TO THE DOCTOR'S OFFICE. I DO NOT NEED IT VERY OFTEN, BUT IS IS GREAT TO HAVE ON HAND. PLEASE LET ME KNOW IF DR. FONTAINE WILL PRESCRIBE THIS FOR ME. THANKS IN ADVANCE FOR YOUR HELP. DOT

## 2014-07-30 ENCOUNTER — Other Ambulatory Visit: Payer: Self-pay | Admitting: Internal Medicine

## 2014-08-07 ENCOUNTER — Other Ambulatory Visit: Payer: Self-pay | Admitting: Internal Medicine

## 2014-08-16 ENCOUNTER — Other Ambulatory Visit: Payer: Self-pay | Admitting: Internal Medicine

## 2014-08-26 ENCOUNTER — Other Ambulatory Visit: Payer: Self-pay | Admitting: Internal Medicine

## 2014-09-10 ENCOUNTER — Encounter: Payer: Self-pay | Admitting: Gynecology

## 2014-09-17 ENCOUNTER — Other Ambulatory Visit: Payer: Self-pay | Admitting: Gynecology

## 2014-09-23 ENCOUNTER — Other Ambulatory Visit: Payer: Self-pay | Admitting: Internal Medicine

## 2014-09-24 ENCOUNTER — Encounter: Payer: Self-pay | Admitting: Gynecology

## 2014-09-30 ENCOUNTER — Telehealth: Payer: Self-pay | Admitting: *Deleted

## 2014-09-30 NOTE — Telephone Encounter (Signed)
PA form filled out and faxed to [facility] for vivelle dot patch 0.05 mg, will wait for response.

## 2014-10-02 NOTE — Telephone Encounter (Signed)
Medication approved through 10/02/2015

## 2014-10-07 ENCOUNTER — Other Ambulatory Visit: Payer: Self-pay | Admitting: Internal Medicine

## 2014-10-15 ENCOUNTER — Other Ambulatory Visit: Payer: Self-pay | Admitting: Internal Medicine

## 2014-10-22 ENCOUNTER — Other Ambulatory Visit: Payer: Self-pay | Admitting: Internal Medicine

## 2014-11-06 ENCOUNTER — Encounter: Payer: Self-pay | Admitting: Gynecology

## 2014-11-06 ENCOUNTER — Ambulatory Visit (INDEPENDENT_AMBULATORY_CARE_PROVIDER_SITE_OTHER): Payer: Medicare Other | Admitting: Gynecology

## 2014-11-06 VITALS — BP 130/82 | Ht 61.0 in | Wt 182.0 lb

## 2014-11-06 DIAGNOSIS — Z01419 Encounter for gynecological examination (general) (routine) without abnormal findings: Secondary | ICD-10-CM

## 2014-11-06 DIAGNOSIS — Z79890 Hormone replacement therapy (postmenopausal): Secondary | ICD-10-CM

## 2014-11-06 DIAGNOSIS — N952 Postmenopausal atrophic vaginitis: Secondary | ICD-10-CM | POA: Diagnosis not present

## 2014-11-06 MED ORDER — ESTRADIOL 0.05 MG/24HR TD PTTW: MEDICATED_PATCH | TRANSDERMAL | Status: DC

## 2014-11-06 MED ORDER — TRIMETHOPRIM 100 MG PO TABS: 100.0000 mg | ORAL_TABLET | Freq: Every day | ORAL | Status: DC | PRN

## 2014-11-06 NOTE — Progress Notes (Signed)
Suzanne Russell 10-09-1935 [id number]        78 y.o.  [id number] for breast and pelvic exam.  Past medical history,surgical history, problem list, medications, allergies, family history and social history were all reviewed and documented as reviewed in the EPIC chart.  ROS:  Performed with pertinent positives and negatives included in the history, assessment and plan.   Additional significant findings :  none   Exam: Kim Counsellor Vitals:   11/06/14 0946  BP: 130/82  Height: 5\' 1"  (1.549 m)  Weight: 182 lb (82.555 kg)   General appearance:  Normal affect, orientation and appearance. Skin: Grossly normal HEENT: Without gross lesions.  No cervical or supraclavicular adenopathy. Thyroid normal.  Lungs:  Clear without wheezing, rales or rhonchi Cardiac: RR, without RMG Abdominal:  Soft, nontender, without masses, guarding, rebound, organomegaly or hernia Breasts:  Examined lying and sitting without masses, retractions, discharge or axillary adenopathy. Pelvic:  Ext/BUS/vagina with generalized atrophic changes  Adnexa  Without masses or tenderness    Anus and perineum  Normal   Rectovaginal  Normal sphincter tone without palpated masses or tenderness.    Assessment/Plan:  79 y.o. [id number] female for breast and pelvic exam.   1. Postmenopausal/HRT. Status post TVH BSO. Using Vivelle 0.05 mg patches. Is tried stopping with unacceptable hot flashes. I reviewed again the risks to include stroke heart attack DVT and breast cancer. Patient finds quality-of-life unacceptable off of HRT once to continue. She clearly understands the issues and risks particularly as she moves through her 25s and I refilled her 1 year. 2. History of recurrent UTIs. Takes trimethoprim at the earliest signs 1 dose and this seems to stop the infection. #30 with 1 refill provided.  Check urinalysis. 3. Mammography 09/2014. Continue with annual mammography. SBE monthly reviewed. 4. Pap smear 2013. No Pap smear  done today. No history of significant abnormal Pap smears. Per current screening guidelines we both agree to stop screening given her age and hysterectomy history. 5. DEXA 2015 normal. Repeat at 5 year interval. Increased calcium vitamin D reviewed. 6. Colonoscopy 2014. Repeat at their recommended interval. 7. Health maintenance. No routine blood work done as this is done at her primary physician's office. Follow up in one year, sooner as needed.     Anastasio Auerbach MD, 10:07 AM 11/06/2014

## 2014-11-06 NOTE — Patient Instructions (Signed)
You may obtain a copy of any labs that were done today by logging onto MyChart as outlined in the instructions provided with your AVS (after visit summary). The office will not call with normal lab results but certainly if there are any significant abnormalities then we will contact you.   Health Maintenance, Female A healthy lifestyle and preventative care can promote health and wellness.  Maintain regular health, dental, and eye exams.  Eat a healthy diet. Foods like vegetables, fruits, whole grains, low-fat dairy products, and lean protein foods contain the nutrients you need without too many calories. Decrease your intake of foods high in solid fats, added sugars, and salt. Get information about a proper diet from your caregiver, if necessary.  Regular physical exercise is one of the most important things you can do for your health. Most adults should get at least 150 minutes of moderate-intensity exercise (any activity that increases your heart rate and causes you to sweat) each week. In addition, most adults need muscle-strengthening exercises on 2 or more days a week.   Maintain a healthy weight. The body mass index (BMI) is a screening tool to identify possible weight problems. It provides an estimate of body fat based on height and weight. Your caregiver can help determine your BMI, and can help you achieve or maintain a healthy weight. For adults 20 years and older:  A BMI below 18.5 is considered underweight.  A BMI of 18.5 to 24.9 is normal.  A BMI of 25 to 29.9 is considered overweight.  A BMI of 30 and above is considered obese.  Maintain normal blood lipids and cholesterol by exercising and minimizing your intake of saturated fat. Eat a balanced diet with plenty of fruits and vegetables. Blood tests for lipids and cholesterol should begin at age 61 and be repeated every 5 years. If your lipid or cholesterol levels are high, you are over 50, or you are a high risk for heart  disease, you may need your cholesterol levels checked more frequently.Ongoing high lipid and cholesterol levels should be treated with medicines if diet and exercise are not effective.  If you smoke, find out from your caregiver how to quit. If you do not use tobacco, do not start.  Lung cancer screening is recommended for adults aged 33 80 years who are at high risk for developing lung cancer because of a history of smoking. Yearly low-dose computed tomography (CT) is recommended for people who have at least a 30-pack-year history of smoking and are a current smoker or have quit within the past 15 years. A pack year of smoking is smoking an average of 1 pack of cigarettes a day for 1 year (for example: 1 pack a day for 30 years or 2 packs a day for 15 years). Yearly screening should continue until the smoker has stopped smoking for at least 15 years. Yearly screening should also be stopped for people who develop a health problem that would prevent them from having lung cancer treatment.  If you are pregnant, do not drink alcohol. If you are breastfeeding, be very cautious about drinking alcohol. If you are not pregnant and choose to drink alcohol, do not exceed 1 drink per day. One drink is considered to be 12 ounces (355 mL) of beer, 5 ounces (148 mL) of wine, or 1.5 ounces (44 mL) of liquor.  Avoid use of street drugs. Do not share needles with anyone. Ask for help if you need support or instructions about stopping  the use of drugs.  High blood pressure causes heart disease and increases the risk of stroke. Blood pressure should be checked at least every 1 to 2 years. Ongoing high blood pressure should be treated with medicines, if weight loss and exercise are not effective.  If you are 59 to 77 years old, ask your caregiver if you should take aspirin to prevent strokes.  Diabetes screening involves taking a blood sample to check your fasting blood sugar level. This should be done once every 3  years, after age 91, if you are within normal weight and without risk factors for diabetes. Testing should be considered at a younger age or be carried out more frequently if you are overweight and have at least 1 risk factor for diabetes.  Breast cancer screening is essential preventative care for women. You should practice "breast self-awareness." This means understanding the normal appearance and feel of your breasts and may include breast self-examination. Any changes detected, no matter how small, should be reported to a caregiver. Women in their 66s and 30s should have a clinical breast exam (CBE) by a caregiver as part of a regular health exam every 1 to 3 years. After age 101, women should have a CBE every year. Starting at age 100, women should consider having a mammogram (breast X-ray) every year. Women who have a family history of breast cancer should talk to their caregiver about genetic screening. Women at a high risk of breast cancer should talk to their caregiver about having an MRI and a mammogram every year.  Breast cancer gene (BRCA)-related cancer risk assessment is recommended for women who have family members with BRCA-related cancers. BRCA-related cancers include breast, ovarian, tubal, and peritoneal cancers. Having family members with these cancers may be associated with an increased risk for harmful changes (mutations) in the breast cancer genes BRCA1 and BRCA2. Results of the assessment will determine the need for genetic counseling and BRCA1 and BRCA2 testing.  The Pap test is a screening test for cervical cancer. Women should have a Pap test starting at age 57. Between ages 25 and 35, Pap tests should be repeated every 2 years. Beginning at age 37, you should have a Pap test every 3 years as long as the past 3 Pap tests have been normal. If you had a hysterectomy for a problem that was not cancer or a condition that could lead to cancer, then you no longer need Pap tests. If you are  between ages 50 and 76, and you have had normal Pap tests going back 10 years, you no longer need Pap tests. If you have had past treatment for cervical cancer or a condition that could lead to cancer, you need Pap tests and screening for cancer for at least 20 years after your treatment. If Pap tests have been discontinued, risk factors (such as a new sexual partner) need to be reassessed to determine if screening should be resumed. Some women have medical problems that increase the chance of getting cervical cancer. In these cases, your caregiver may recommend more frequent screening and Pap tests.  The human papillomavirus (HPV) test is an additional test that may be used for cervical cancer screening. The HPV test looks for the virus that can cause the cell changes on the cervix. The cells collected during the Pap test can be tested for HPV. The HPV test could be used to screen women aged 44 years and older, and should be used in women of any age  who have unclear Pap test results. After the age of 82, women should have HPV testing at the same frequency as a Pap test.  Colorectal cancer can be detected and often prevented. Most routine colorectal cancer screening begins at the age of 76 and continues through age 84. However, your caregiver may recommend screening at an earlier age if you have risk factors for colon cancer. On a yearly basis, your caregiver may provide home test kits to check for hidden blood in the stool. Use of a small camera at the end of a tube, to directly examine the colon (sigmoidoscopy or colonoscopy), can detect the earliest forms of colorectal cancer. Talk to your caregiver about this at age 68, when routine screening begins. Direct examination of the colon should be repeated every 5 to 10 years through age 3, unless early forms of pre-cancerous polyps or small growths are found.  Hepatitis C blood testing is recommended for all people born from [birth year] through 1963 and any  individual with known risks for hepatitis C.  Practice safe sex. Use condoms and avoid high-risk sexual practices to reduce the spread of sexually transmitted infections (STIs). Sexually active women aged 24 and younger should be checked for Chlamydia, which is a common sexually transmitted infection. Older women with new or multiple partners should also be tested for Chlamydia. Testing for other STIs is recommended if you are sexually active and at increased risk.  Osteoporosis is a disease in which the bones lose minerals and strength with aging. This can result in serious bone fractures. The risk of osteoporosis can be identified using a bone density scan. Women ages 22 and over and women at risk for fractures or osteoporosis should discuss screening with their caregivers. Ask your caregiver whether you should be taking a calcium supplement or vitamin D to reduce the rate of osteoporosis.  Menopause can be associated with physical symptoms and risks. Hormone replacement therapy is available to decrease symptoms and risks. You should talk to your caregiver about whether hormone replacement therapy is right for you.  Use sunscreen. Apply sunscreen liberally and repeatedly throughout the day. You should seek shade when your shadow is shorter than you. Protect yourself by wearing long sleeves, pants, a wide-brimmed hat, and sunglasses year round, whenever you are outdoors.  Notify your caregiver of new moles or changes in moles, especially if there is a change in shape or color. Also notify your caregiver if a mole is larger than the size of a pencil eraser.  Stay current with your immunizations. Document Released: 01/04/2011 Document Revised: 10/16/2012 Document Reviewed: 01/04/2011 [facility] Patient Information 2014 [place].

## 2014-11-07 LAB — URINALYSIS W MICROSCOPIC + REFLEX CULTURE
Bacteria, UA: NONE SEEN
Bilirubin Urine: NEGATIVE
CASTS: NONE SEEN
Crystals: NONE SEEN
GLUCOSE, UA: NEGATIVE mg/dL
HGB URINE DIPSTICK: NEGATIVE
Ketones, ur: NEGATIVE mg/dL
LEUKOCYTES UA: NEGATIVE
Nitrite: NEGATIVE
PROTEIN: NEGATIVE mg/dL
SQUAMOUS EPITHELIAL / LPF: NONE SEEN
Specific Gravity, Urine: 1.007 (ref 1.005–1.030)
Urobilinogen, UA: 0.2 mg/dL (ref 0.0–1.0)
pH: 6.5 (ref 5.0–8.0)

## 2014-11-25 ENCOUNTER — Other Ambulatory Visit: Payer: Self-pay | Admitting: Internal Medicine

## 2014-12-03 ENCOUNTER — Telehealth: Payer: Self-pay

## 2014-12-03 MED ORDER — TRAMADOL HCL 50 MG PO TABS: ORAL_TABLET | ORAL | Status: DC

## 2014-12-03 NOTE — Telephone Encounter (Signed)
Tramadol 50 mg #90 Take 1 tablet every six hours as needed for pain  [facility]

## 2014-12-03 NOTE — Telephone Encounter (Signed)
Rx called in to pharmacy. 

## 2014-12-25 ENCOUNTER — Telehealth: Payer: Self-pay | Admitting: *Deleted

## 2014-12-25 MED ORDER — TRAMADOL HCL 50 MG PO TABS: ORAL_TABLET | ORAL | Status: DC

## 2014-12-25 NOTE — Telephone Encounter (Signed)
Patient is requesting a refill of traMADol (ULTRAM) 50 MG tablet, take one tab every 6 hours as needed for pain. [facility] [phone] fax [phone]

## 2014-12-25 NOTE — Telephone Encounter (Signed)
Rx phoned into pharmacy.

## 2014-12-25 NOTE — Telephone Encounter (Signed)
ok 

## 2015-01-13 ENCOUNTER — Other Ambulatory Visit: Payer: Self-pay | Admitting: Internal Medicine

## 2015-01-14 ENCOUNTER — Other Ambulatory Visit: Payer: Self-pay | Admitting: Internal Medicine

## 2015-01-15 ENCOUNTER — Other Ambulatory Visit (INDEPENDENT_AMBULATORY_CARE_PROVIDER_SITE_OTHER): Payer: Medicare Other

## 2015-01-15 DIAGNOSIS — Z0000 Encounter for general adult medical examination without abnormal findings: Secondary | ICD-10-CM | POA: Diagnosis not present

## 2015-01-15 DIAGNOSIS — I10 Essential (primary) hypertension: Secondary | ICD-10-CM | POA: Diagnosis not present

## 2015-01-15 LAB — CBC WITH DIFFERENTIAL/PLATELET
Basophils Absolute: 0 10*3/uL (ref 0.0–0.1)
Basophils Relative: 0.5 % (ref 0.0–3.0)
EOS ABS: 0.1 10*3/uL (ref 0.0–0.7)
Eosinophils Relative: 1.6 % (ref 0.0–5.0)
HEMATOCRIT: 37.4 % (ref 36.0–46.0)
HEMOGLOBIN: 12.5 g/dL (ref 12.0–15.0)
Lymphocytes Relative: 31.7 % (ref 12.0–46.0)
Lymphs Abs: 2.4 10*3/uL (ref 0.7–4.0)
MCHC: 33.4 g/dL (ref 30.0–36.0)
MCV: 92.6 fl (ref 78.0–100.0)
MONO ABS: 0.6 10*3/uL (ref 0.1–1.0)
Monocytes Relative: 7.4 % (ref 3.0–12.0)
Neutro Abs: 4.5 10*3/uL (ref 1.4–7.7)
Neutrophils Relative %: 58.8 % (ref 43.0–77.0)
PLATELETS: 334 10*3/uL (ref 150.0–400.0)
RBC: 4.04 Mil/uL (ref 3.87–5.11)
RDW: 14.2 % (ref 11.5–15.5)
WBC: 7.6 10*3/uL (ref 4.0–10.5)

## 2015-01-15 LAB — HEPATIC FUNCTION PANEL
ALBUMIN: 4.2 g/dL (ref 3.5–5.2)
ALK PHOS: 51 U/L (ref 39–117)
ALT: 11 U/L (ref 0–35)
AST: 14 U/L (ref 0–37)
BILIRUBIN TOTAL: 0.4 mg/dL (ref 0.2–1.2)
Bilirubin, Direct: 0 mg/dL (ref 0.0–0.3)
Total Protein: 7.7 g/dL (ref 6.0–8.3)

## 2015-01-15 LAB — LIPID PANEL
Cholesterol: 224 mg/dL — ABNORMAL HIGH (ref 0–200)
HDL: 57.4 mg/dL (ref >39.00)
LDL CALC: 150 mg/dL — AB (ref 0–99)
NONHDL: 166.6
TRIGLYCERIDES: 82 mg/dL (ref 0.0–149.0)
Total CHOL/HDL Ratio: 4
VLDL: 16.4 mg/dL (ref 0.0–40.0)

## 2015-01-15 LAB — POCT URINALYSIS DIPSTICK
Bilirubin, UA: NEGATIVE
GLUCOSE UA: NEGATIVE
Ketones, UA: NEGATIVE
Leukocytes, UA: NEGATIVE
NITRITE UA: NEGATIVE
Protein, UA: NEGATIVE
Spec Grav, UA: 1.01
Urobilinogen, UA: 0.2
pH, UA: 7

## 2015-01-15 LAB — BASIC METABOLIC PANEL
BUN: 12 mg/dL (ref 6–23)
CO2: 26 meq/L (ref 19–32)
CREATININE: 0.8 mg/dL (ref 0.40–1.20)
Calcium: 9.7 mg/dL (ref 8.4–10.5)
Chloride: 100 mEq/L (ref 96–112)
GFR: 73.93 mL/min (ref >60.00)
GLUCOSE: 91 mg/dL (ref 70–99)
Potassium: 4.1 mEq/L (ref 3.5–5.1)
SODIUM: 136 meq/L (ref 135–145)

## 2015-01-15 LAB — TSH: TSH: 2.42 u[IU]/mL (ref 0.35–4.50)

## 2015-01-23 ENCOUNTER — Encounter: Payer: Self-pay | Admitting: Internal Medicine

## 2015-01-23 ENCOUNTER — Ambulatory Visit (INDEPENDENT_AMBULATORY_CARE_PROVIDER_SITE_OTHER): Payer: Medicare Other | Admitting: Internal Medicine

## 2015-01-23 VITALS — BP 140/70 | HR 85 | Temp 98.5°F | Resp 20 | Ht 60.5 in | Wt 185.0 lb

## 2015-01-23 DIAGNOSIS — I10 Essential (primary) hypertension: Secondary | ICD-10-CM

## 2015-01-23 DIAGNOSIS — Z0000 Encounter for general adult medical examination without abnormal findings: Secondary | ICD-10-CM

## 2015-01-23 DIAGNOSIS — R209 Unspecified disturbances of skin sensation: Secondary | ICD-10-CM

## 2015-01-23 MED ORDER — AMLODIPINE BESYLATE 2.5 MG PO TABS: 2.5000 mg | ORAL_TABLET | Freq: Every day | ORAL | Status: DC

## 2015-01-23 MED ORDER — DULOXETINE HCL 30 MG PO CPEP: ORAL_CAPSULE | ORAL | Status: DC

## 2015-01-23 NOTE — Patient Instructions (Signed)
Limit your sodium (Salt) intake  Please check your blood pressure on a regular basis.  If it is consistently greater than 150/90, please make an office appointment.    It is important that you exercise regularly, at least 20 minutes 3 to 4 times per week.  If you develop chest pain or shortness of breath seek  medical attention.  You need to lose weight.  Consider a lower calorie diet and regular exercise.  Cardiac Diet This diet can help prevent heart disease and stroke. Many factors influence your heart health, including eating and exercise habits. Coronary risk rises a lot with abnormal blood fat (lipid) levels. Cardiac meal planning includes limiting unhealthy fats, increasing healthy fats, and making other small dietary changes. General guidelines are as follows:  Adjust calorie intake to reach and maintain desirable body weight.  Limit total fat intake to less than 30% of total calories. Saturated fat should be less than 7% of calories.  Saturated fats are found in animal products and in some vegetable products. Saturated vegetable fats are found in coconut oil, cocoa butter, palm oil, and palm kernel oil. Read labels carefully to avoid these products as much as possible. Use butter in moderation. Choose tub margarines and oils that have 2 grams of fat or less. Good cooking oils are canola and olive oils.  Practice low-fat cooking techniques. Do not fry food. Instead, broil, bake, boil, steam, grill, roast on a rack, stir-fry, or microwave it. Other fat reducing suggestions include:  Remove the skin from poultry.  Remove all visible fat from meats.  Skim the fat off stews, soups, and gravies before serving them.  Steam vegetables in water or broth instead of sauting them in fat.  Avoid foods with trans fat (or hydrogenated oils), such as commercially fried foods and commercially baked goods. Commercial shortening and deep-frying fats will contain trans fat.  Increase intake of  fruits, vegetables, whole grains, and legumes to replace foods high in fat.  Increase consumption of nuts, legumes, and seeds to at least 4 servings weekly. One serving of a legume equals  cup, and 1 serving of nuts or seeds equals  cup.  Choose whole grains more often. Have 3 servings per day (a serving is 1 ounce [oz]).  Eat 4 to 5 servings of vegetables per day. A serving of vegetables is 1 cup of raw leafy vegetables;  cup of raw or cooked cut-up vegetables;  cup of vegetable juice.  Eat 4 to 5 servings of fruit per day. A serving of fruit is 1 medium whole fruit;  cup of dried fruit;  cup of fresh, frozen, or canned fruit;  cup of 100% fruit juice.  Increase your intake of dietary fiber to 20 to 30 grams per day. Insoluble fiber may help lower your risk of heart disease and may help curb your appetite.  Soluble fiber binds cholesterol to be removed from the blood. Foods high in soluble fiber are dried beans, citrus fruits, oats, apples, bananas, broccoli, Brussels sprouts, and eggplant.  Try to include foods fortified with plant sterols or stanols, such as yogurt, breads, juices, or margarines. Choose several fortified foods to achieve a daily intake of 2 to 3 grams of plant sterols or stanols.  Foods with omega-3 fats can help reduce your risk of heart disease. Aim to have a 3.5 oz portion of fatty fish twice per week, such as salmon, mackerel, albacore tuna, sardines, lake trout, or herring. If you wish to take a fish  oil supplement, choose one that contains 1 gram of both DHA and EPA.  Limit processed meats to 2 servings (3 oz portion) weekly.  Limit the sodium in your diet to 1500 milligrams (mg) per day. If you have high blood pressure, talk to a registered dietitian about a DASH (Dietary Approaches to Stop Hypertension) eating plan.  Limit sweets and beverages with added sugar, such as soda, to no more than 5 servings per week. One serving is:   1 tablespoon sugar.  1  tablespoon jelly or jam.   cup sorbet.  1 cup lemonade.   cup regular soda. CHOOSING FOODS Starches  Allowed: Breads: All kinds (wheat, rye, raisin, white, oatmeal, [place], [place], and English muffin bread). Low-fat rolls: English muffins, frankfurter and hamburger buns, bagels, pita bread, tortillas (not fried). Pancakes, waffles, biscuits, and muffins made with recommended oil.  Avoid: Products made with saturated or trans fats, oils, or whole milk products. Butter rolls, cheese breads, croissants. Commercial doughnuts, muffins, sweet rolls, biscuits, waffles, pancakes, store-bought mixes. Crackers  Allowed: Low-fat crackers and snacks: Animal, graham, rye, saltine (with recommended oil, no lard), oyster, and matzo crackers. Bread sticks, melba toast, rusks, flatbread, pretzels, and light popcorn.  Avoid: High-fat crackers: cheese crackers, butter crackers, and those made with coconut, palm oil, or trans fat (hydrogenated oils). Buttered popcorn. Cereals  Allowed: Hot or cold whole-grain cereals.  Avoid: Cereals containing coconut, hydrogenated vegetable fat, or animal fat. Potatoes / Pasta / Rice  Allowed: All kinds of potatoes, rice, and pasta (such as macaroni, spaghetti, and noodles).  Avoid: Pasta or rice prepared with cream sauce or high-fat cheese. Chow mein noodles, [place] fries. Vegetables  Allowed: All vegetables and vegetable juices.  Avoid: Fried vegetables. Vegetables in cream, butter, or high-fat cheese sauces. Limit coconut. Fruit in cream or custard. Protein  Allowed: Limit your intake of meat, seafood, and poultry to no more than 6 oz (cooked weight) per day. All lean, well-trimmed beef, veal, pork, and lamb. All chicken and [place] without skin. All fish and shellfish. Wild game: wild duck, rabbit, pheasant, and venison. Egg whites or low-cholesterol egg substitutes may be used as desired. Meatless dishes: recipes with dried beans, peas, lentils, and tofu  (soybean curd). Seeds and nuts: all seeds and most nuts.  Avoid: Prime grade and other heavily marbled and fatty meats, such as short ribs, spare ribs, rib eye roast or steak, frankfurters, sausage, bacon, and high-fat luncheon meats, mutton. Caviar. Commercially fried fish. Domestic duck, goose, venison sausage. Organ meats: liver, gizzard, heart, chitterlings, brains, kidney, sweetbreads. Dairy  Allowed: Low-fat cheeses: nonfat or low-fat cottage cheese (1% or 2% fat), cheeses made with part skim milk, such as mozzarella, farmers, string, or ricotta. (Cheeses should be labeled no more than 2 to 6 grams fat per oz.). Skim (or 1%) milk: liquid, powdered, or evaporated. Buttermilk made with low-fat milk. Drinks made with skim or low-fat milk or cocoa. Chocolate milk or cocoa made with skim or low-fat (1%) milk. Nonfat or low-fat yogurt.  Avoid: Whole milk cheeses, including colby, cheddar, mu[place], Monterey Jack, [place], [place], [place], American, Swiss, and blue. Creamed cottage cheese, cream cheese. Whole milk and whole milk products, including buttermilk or yogurt made from whole milk, drinks made from whole milk. Condensed milk, evaporated whole milk, and 2% milk. Soups and Combination Foods  Allowed: Low-fat low-sodium soups: broth, dehydrated soups, homemade broth, soups with the fat removed, homemade cream soups made with skim or low-fat milk. Low-fat spaghetti, lasagna, chili, and Spanish  rice if low-fat ingredients and low-fat cooking techniques are used.  Avoid: Cream soups made with whole milk, cream, or high-fat cheese. All other soups. Desserts and Sweets  Allowed: Sherbet, fruit ices, gelatins, meringues, and angel food cake. Homemade desserts with recommended fats, oils, and milk products. Jam, jelly, honey, marmalade, sugars, and syrups. Pure sugar candy, such as gum drops, hard candy, jelly beans, marshmallows, mints, and small amounts of dark chocolate.  Avoid: Commercially  prepared cakes, pies, cookies, frosting, pudding, or mixes for these products. Desserts containing whole milk products, chocolate, coconut, lard, palm oil, or palm kernel oil. Ice cream or ice cream drinks. Candy that contains chocolate, coconut, butter, hydrogenated fat, or unknown ingredients. Buttered syrups. Fats and Oils  Allowed: Vegetable oils: safflower, sunflower, corn, soybean, cottonseed, sesame, canola, olive, or peanut. Non-hydrogenated margarines. Salad dressing or mayonnaise: homemade or commercial, made with a recommended oil. Low or nonfat salad dressing or mayonnaise.  Limit added fats and oils to 6 to 8 tsp per day (includes fats used in cooking, baking, salads, and spreads on bread). Remember to count the "hidden fats" in foods.  Avoid: Solid fats and shortenings: butter, lard, salt pork, bacon drippings. Gravy containing meat fat, shortening, or suet. Cocoa butter, coconut. Coconut oil, palm oil, palm kernel oil, or hydrogenated oils: these ingredients are often used in bakery products, nondairy creamers, whipped toppings, candy, and commercially fried foods. Read labels carefully. Salad dressings made of unknown oils, sour cream, or cheese, such as blue cheese and Roquefort. Cream, all kinds: half-and-half, light, heavy, or whipping. Sour cream or cream cheese (even if "light" or low-fat). Nondairy cream substitutes: coffee creamers and sour cream substitutes made with palm, palm kernel, hydrogenated oils, or coconut oil. Beverages  Allowed: Coffee (regular or decaffeinated), tea. Diet carbonated beverages, mineral water. Alcohol: Check with your caregiver. Moderation is recommended.  Avoid: Whole milk, regular sodas, and juice drinks with added sugar. Condiments  Allowed: All seasonings and condiments. Cocoa powder. "Cream" sauces made with recommended ingredients.  Avoid: Carob powder made with hydrogenated fats. SAMPLE MENU Breakfast   cup orange juice   cup  oatmeal  1 slice toast  1 tsp margarine  1 cup skim milk Lunch  [place] sandwich with 2 oz [place], 2 slices bread  Lettuce and tomato slices  Fresh fruit  Carrot sticks  Coffee or tea Snack  Fresh fruit or low-fat crackers Dinner  3 oz lean ground beef  1 baked potato  1 tsp margarine   cup asparagus  Lettuce salad  1 tbs non-creamy dressing   cup peach slices  1 cup skim milk Document Released: 03/30/2008 Document Revised: 12/21/2011 Document Reviewed: 08/21/2013 [employer] Patient Information 2015 [place], [place]. This information is not intended to replace advice given to you by your health care provider. Make sure you discuss any questions you have with your health care provider.

## 2015-01-23 NOTE — Progress Notes (Signed)
Subjective:    Patient ID: Suzanne Russell, female    DOB: 1935-09-14, 79 y.o.   MRN: [id number]  HPI   Wt Readings from Last 3 Encounters:  01/23/15 185 lb (83.915 kg)  11/06/14 182 lb (82.555 kg)  12/27/13 174 lb (78.926 kg)     Subjective:    Patient ID: Suzanne Russell, female    DOB: [birth year], 79 y.o.   MRN: [id number]  HPI  79  year-old patient who is seen today for a preventive health examination.  She's had  orthopedic surgery for surgical removal of both second toes. She has done quite well. She's had a recent gynecologic evaluation and has had a hysterectomy in 2014.  She has treated hypertension, which has done well.  Colonoscopy 2013  Past Medical History  Diagnosis Date  . Heart palpitations   . Shingles   . Pedal edema   . HA (headache)   . Hypertension   . Chronic cough   . Neuropathy   . Neuromuscular disorder     nerve damage after shingles  . GERD (gastroesophageal reflux disease)   . Urinary incontinence     History   Social History  . Marital Status: Married    Spouse Name: N/A  . Number of Children: 2  . Years of Education: N/A   Occupational History  . retired Pharmacist, hospital    Social History Main Topics  . Smoking status: Never Smoker   . Smokeless tobacco: Never Used  . Alcohol Use: No  . Drug Use: No  . Sexual Activity: No     Comment: 1st intercourse 89 yo-1 partner   Other Topics Concern  . Not on file   Social History Narrative    Past Surgical History  Procedure Laterality Date  . Tonsillectomy    . Colonoscopy  2005  . Toe amputation      X 2  . Myomectomy vaginal approach  2012  . Vaginal hysterectomy N/A 12/20/2012    Procedure: TOTAL VAGINAL HYSTERECTOMY;  Surgeon: Anastasio Auerbach, MD;  Location: [facility];  Service: Gynecology;  Laterality: N/A;  . Anterior and posterior repair N/A 12/20/2012    Procedure: ANTERIOR (CYSTOCELE) AND POSTERIOR REPAIR (RECTOCELE);  Surgeon: Anastasio Auerbach, MD;  Location: [facility];   Service: Gynecology;  Laterality: N/A;    Family History  Problem Relation Age of Onset  . COPD Father   . Coronary artery disease Father   . Breast cancer Maternal Aunt 49  . Hypertension Brother   . Breast cancer Maternal Aunt 45    Allergies  Allergen Reactions  . Naproxen     Tongue swelling  . Hydrocodone Rash  . Restasis [Cyclosporine] Other (See Comments)    Eyes real red and  burn  . Ciprofloxacin     Burning  . Oxycodone Rash    Current Outpatient Prescriptions on File Prior to Visit  Medication Sig Dispense Refill  . aspirin 81 MG EC tablet Take 81 mg by mouth daily.      . Calcium Carbonate (CALTRATE 600) 1500 MG TABS Take 600 mg by mouth daily.     . cloNIDine (CATAPRES) 0.1 MG tablet TAKE 1 TABLET EVERY 6 HOURS IF SYSTOLIC BLOOD PRESSURE IS GREATER THAN 170. 90 tablet 0  . estradiol (VIVELLE-DOT) 0.05 MG/24HR patch APPLY ONE PATCH TWICE A WEEK, CHANGE SITES AND REAPPLY AS DIRECTED. 8 patch 12  . furosemide (LASIX) 40 MG tablet TAKE 1 TABLET DAILY. 90 tablet 0  .  MICARDIS 80 MG tablet TAKE 1 TABLET ONCE DAILY. 90 tablet 1  . Multiple Vitamin (MULTIVITAMIN) tablet Take 1 tablet by mouth every other day.     Marland Kitchen NEURONTIN 800 MG tablet TAKE 1 TABLET EVERY 4 HOURS AS NEEDED,NOT TO EXCEED 5 PER DAY 150 tablet 2  . omeprazole (PRILOSEC) 40 MG capsule TAKE (1) CAPSULE DAILY. 90 capsule 3  . traMADol (ULTRAM) 50 MG tablet TAKE (1) TABLET EVERY SIX HOURS AS NEEDED FOR PAIN. 90 tablet 1  . trimethoprim (TRIMPEX) 100 MG tablet Take 1 tablet (100 mg total) by mouth daily as needed. 30 tablet 1   No current facility-administered medications on file prior to visit.    BP 140/70 mmHg  Pulse 85  Temp(Src) 98.5 F (36.9 C) (Oral)  Resp 20  Ht 5' 0.5" (1.537 m)  Wt 185 lb (83.915 kg)  BMI 35.52 kg/m2  SpO2 96%   1. Risk factors, based on past  M,S,F history-  cardiovascular risk factors include a history of hypertension  2.  Physical activities:  Limited only by  orthopedic issues denies any exertional chest pain or exertional dyspnea on exertion  3.  Depression/mood: No history depression or mood disorder  4.  Hearing: No deficits  5.  ADL's: Independent in all aspects of daily living  6.  Fall risk: Low  7.  Home safety: No problems identified  8.  Height weight, and visual acuity; height and weight stable no change in visual acuity. Does obtain annual ophthalmology evaluation  9.  Counseling: Heart healthy diet reck her exercise moderate weight loss all encouraged  10. Lab orders based on risk factors: Laboratory update will be reviewed 11. Referral : Followup OB/GYN  12. Care plan: Plan is for elective vaginal hysterectomy. OB/GYN has scheduled a followup DEXA study  13. Cognitive assessment: Alert and oriented normal affect. No cognitive dysfunction  14.  Preventive services will include annual clinical examinations with screening lab.  She had her final screening colonoscopy in 2013.  We'll consider screening mammograms until age 43 Patient was provided with a written and personalized care plan  15.  Provider list includes primary care medicine, ophthalmology and radiology       Review of Systems  Constitutional: Negative for fever, appetite change, fatigue and unexpected weight change.  HENT: Negative for hearing loss, ear pain, nosebleeds, congestion, sore throat, mouth sores, trouble swallowing, neck stiffness, dental problem, voice change, sinus pressure and tinnitus.   Eyes: Negative for photophobia, pain, redness and visual disturbance.  Respiratory: Negative for cough, chest tightness and shortness of breath.   Cardiovascular: Negative for chest pain, palpitations and leg swelling.  Gastrointestinal: Negative for nausea, vomiting, abdominal pain, diarrhea, constipation, blood in stool, abdominal distention and rectal pain.  Genitourinary: Negative for dysuria, urgency, frequency, hematuria, flank pain, vaginal bleeding,  vaginal discharge, difficulty urinating, genital sores, vaginal pain, menstrual problem and pelvic pain.  Musculoskeletal: Negative for back pain and arthralgias.  Skin: Negative for rash.  Neurological: Positive for numbness. Negative for dizziness, syncope, speech difficulty, weakness, light-headedness and headaches.  Hematological: Negative for adenopathy. Does not bruise/bleed easily.  Psychiatric/Behavioral: Negative for suicidal ideas, behavioral problems, self-injury, dysphoric mood and agitation. The patient is not nervous/anxious.        Objective:   Physical Exam  Constitutional: She is oriented to person, place, and time. She appears well-developed and well-nourished.  HENT:  Head: Normocephalic and atraumatic.  Right Ear: External ear normal.  Left Ear: External ear normal.  Mouth/Throat: Oropharynx is clear and moist.  Eyes: Conjunctivae and EOM are normal.  Neck: Normal range of motion. Neck supple. No JVD present. No thyromegaly present.  Cardiovascular: Normal rate, regular rhythm, normal heart sounds and intact distal pulses.   No murmur heard. Pulmonary/Chest: Effort normal and breath sounds normal. She has no wheezes. She has no rales.  Abdominal: Soft. Bowel sounds are normal. She exhibits no distension and no mass. There is no tenderness. There is no rebound and no guarding.  Musculoskeletal: Normal range of motion. She exhibits no edema and no tenderness.  Hallux valgus deformities with bilateral second toe amputations  Neurological: She is alert and oriented to person, place, and time. She has normal reflexes. No cranial nerve deficit. She exhibits normal muscle tone. Coordination normal.  Skin: Skin is warm and dry. No rash noted.  Psychiatric: She has a normal mood and affect. Her behavior is normal.          Assessment & Plan:    Preventive health examination Hypertension stable History of peripheral neuropathy  Continue present regimen Weight loss  encouraged Continue home blood pressure monitoring Recheck 6 months  Review of Systems  Musculoskeletal: Positive for gait problem.       Chronic left knee pain that affects activities  Neurological: Positive for numbness.     As above.  Objective:   Physical Exam    As above     Assessment & Plan:    Preventive health care hypertension, well-controlled Peripheral neuropathy.  We'll continue present regimen Osteoarthritis with chronic left knee pain  Weight loss encouraged Home blood pressure monitoring recommended Recheck in one year or as needed

## 2015-01-23 NOTE — Progress Notes (Signed)
Pre visit review using our clinic review tool, if applicable. No additional management support is needed unless otherwise documented below in the visit note. 

## 2015-02-09 ENCOUNTER — Other Ambulatory Visit: Payer: Self-pay | Admitting: Internal Medicine

## 2015-02-12 ENCOUNTER — Other Ambulatory Visit: Payer: Self-pay | Admitting: Internal Medicine

## 2015-04-07 ENCOUNTER — Ambulatory Visit (INDEPENDENT_AMBULATORY_CARE_PROVIDER_SITE_OTHER): Payer: Medicare Other | Admitting: *Deleted

## 2015-04-07 DIAGNOSIS — Z23 Encounter for immunization: Secondary | ICD-10-CM

## 2015-04-20 ENCOUNTER — Other Ambulatory Visit: Payer: Self-pay | Admitting: Internal Medicine

## 2015-04-21 ENCOUNTER — Other Ambulatory Visit: Payer: Self-pay | Admitting: Internal Medicine

## 2015-04-22 ENCOUNTER — Telehealth: Payer: Self-pay | Admitting: Internal Medicine

## 2015-04-22 ENCOUNTER — Other Ambulatory Visit: Payer: Self-pay | Admitting: Gynecology

## 2015-04-22 MED ORDER — TRAMADOL HCL 50 MG PO TABS: ORAL_TABLET | ORAL | Status: DC

## 2015-04-22 NOTE — Telephone Encounter (Signed)
Pt's husband notified Rx called into pharmacy.

## 2015-04-22 NOTE — Telephone Encounter (Signed)
Pt request refill of the following: traMADol (ULTRAM) 50 MG tablet   Pt said she is out    Phamacy: [facility]

## 2015-07-08 ENCOUNTER — Telehealth: Payer: Self-pay

## 2015-07-08 MED ORDER — TRIMETHOPRIM 100 MG PO TABS: ORAL_TABLET | ORAL | Status: DC

## 2015-07-08 NOTE — Telephone Encounter (Signed)
Pt requesting refill of trimethoprim (TRIMPEX) 100 MG tablet Pt last visit 01/23/15 Pt last Rx refill 04/22/15 #30 with 1 refill

## 2015-07-08 NOTE — Telephone Encounter (Signed)
Ok for RF 

## 2015-07-08 NOTE — Telephone Encounter (Signed)
Rx sent to pharmacy   

## 2015-07-11 ENCOUNTER — Telehealth: Payer: Self-pay | Admitting: Internal Medicine

## 2015-07-11 MED ORDER — TELMISARTAN 80 MG PO TABS: 80.0000 mg | ORAL_TABLET | Freq: Every day | ORAL | Status: AC

## 2015-07-11 NOTE — Telephone Encounter (Signed)
Pt's husband notified Rx sent to pharmacy as requested.

## 2015-07-11 NOTE — Telephone Encounter (Signed)
Pt stated that pharmacy sent a request for Micardis on Monday and they have yet to get back a response. Pt was wondering if this could be refilled and sent the [facility] in [place]. This will be the new pharmacy to send all meds to. Please advise.

## 2015-07-24 ENCOUNTER — Telehealth: Payer: Self-pay | Admitting: Internal Medicine

## 2015-07-24 MED ORDER — NEURONTIN 800 MG PO TABS: ORAL_TABLET | ORAL | Status: AC

## 2015-07-24 NOTE — Telephone Encounter (Signed)
Pt notified Rx sent to pharmacy

## 2015-07-24 NOTE — Telephone Encounter (Signed)
Pt request refill of the following: NEURONTIN 800 MG tablet  Pt said she need this today please    Phamacy: [facility]

## 2015-08-05 ENCOUNTER — Telehealth: Payer: Self-pay | Admitting: Internal Medicine

## 2015-08-05 MED ORDER — AMLODIPINE BESYLATE 2.5 MG PO TABS: 2.5000 mg | ORAL_TABLET | Freq: Every day | ORAL | Status: AC

## 2015-08-05 NOTE — Telephone Encounter (Signed)
Pt needs amlodipine 2.5 mg #30  send to [facility]

## 2015-08-05 NOTE — Telephone Encounter (Signed)
Rx sent to pharmacy   

## 2015-09-03 ENCOUNTER — Telehealth: Payer: Self-pay | Admitting: Internal Medicine

## 2015-09-03 MED ORDER — TRAMADOL HCL 50 MG PO TABS: ORAL_TABLET | ORAL | Status: AC

## 2015-09-03 NOTE — Telephone Encounter (Signed)
Spoke to pt, told her Rx called into pharmacy as requested. Pt verbalized understanding.

## 2015-09-03 NOTE — Telephone Encounter (Signed)
Pt needs new rx tramadol 50 mg #90 for 30 day supply send to [facility] in [place]

## 2015-09-09 ENCOUNTER — Telehealth: Payer: Self-pay | Admitting: Internal Medicine

## 2015-09-09 MED ORDER — DULOXETINE HCL 30 MG PO CPEP: ORAL_CAPSULE | ORAL | Status: AC

## 2015-09-09 NOTE — Telephone Encounter (Signed)
Pt notified Rx sent to pharmacy

## 2015-09-09 NOTE — Telephone Encounter (Signed)
Pt request refill  DULoxetine (CYMBALTA) 30 MG capsule 90 day  [address] [facility]/[place], [place]

## 2015-10-21 ENCOUNTER — Telehealth: Payer: Self-pay | Admitting: *Deleted

## 2015-10-21 NOTE — Telephone Encounter (Signed)
PA done for estradiol 0.05 mg patch was approved 07/04/16, pharmacy informed as well

## 2015-11-05 ENCOUNTER — Telehealth: Payer: Self-pay | Admitting: Internal Medicine

## 2015-11-05 NOTE — Telephone Encounter (Signed)
FYI pt has moved to [place] and has found at new md.

## 2015-11-07 ENCOUNTER — Encounter: Payer: Self-pay | Admitting: Gynecology

## 2015-11-07 ENCOUNTER — Ambulatory Visit (INDEPENDENT_AMBULATORY_CARE_PROVIDER_SITE_OTHER): Payer: Medicare Other | Admitting: Gynecology

## 2015-11-07 VITALS — BP 124/78 | Ht 61.0 in | Wt 188.0 lb

## 2015-11-07 DIAGNOSIS — N952 Postmenopausal atrophic vaginitis: Secondary | ICD-10-CM

## 2015-11-07 DIAGNOSIS — N816 Rectocele: Secondary | ICD-10-CM | POA: Diagnosis not present

## 2015-11-07 DIAGNOSIS — R51 Headache: Secondary | ICD-10-CM

## 2015-11-07 DIAGNOSIS — Z79890 Hormone replacement therapy (postmenopausal): Secondary | ICD-10-CM | POA: Diagnosis not present

## 2015-11-07 DIAGNOSIS — Z01419 Encounter for gynecological examination (general) (routine) without abnormal findings: Secondary | ICD-10-CM

## 2015-11-07 DIAGNOSIS — R519 Headache, unspecified: Secondary | ICD-10-CM

## 2015-11-07 DIAGNOSIS — R32 Unspecified urinary incontinence: Secondary | ICD-10-CM | POA: Diagnosis not present

## 2015-11-07 DIAGNOSIS — N8111 Cystocele, midline: Secondary | ICD-10-CM | POA: Diagnosis not present

## 2015-11-07 MED ORDER — ESTRADIOL 0.05 MG/24HR TD PTTW: MEDICATED_PATCH | TRANSDERMAL | Status: DC

## 2015-11-07 NOTE — Progress Notes (Signed)
Suzanne Russell 1935/10/18 [id number]        79 y.o.  [id number]  for breast and pelvic exam  Past medical history,surgical history, problem list, medications, allergies, family history and social history were all reviewed and documented as reviewed in the EPIC chart.  ROS:  Performed with pertinent positives and negatives included in the history, assessment and plan.   Additional significant findings :  As discussed below   Exam: Caryn Bee assistant Filed Vitals:   11/07/15 1018  BP: 124/78  Height: 5\' 1"  (1.549 m)  Weight: 188 lb (85.276 kg)   General appearance:  Normal affect, orientation and appearance. Skin: Grossly normal HEENT: Without gross lesions.  No cervical or supraclavicular adenopathy. Thyroid normal.  Lungs:  Clear without wheezing, rales or rhonchi Cardiac: RR, without RMG Abdominal:  Soft, nontender, without masses, guarding, rebound, organomegaly or hernia Breasts:  Examined lying and sitting without masses, retractions, discharge or axillary adenopathy. Pelvic:  Ext/BUS/vagina with atrophic changes. First-degree cystocele. Mild rectocele. Cuff well supported.  Adnexa without masses or tenderness    Anus and perineum normal   Rectovaginal normal sphincter tone without palpated masses or tenderness.    Assessment/Plan:  80 y.o. [id number] female for breast and pelvic exam.   1. Postmenopausal/HRT. Status post TVH/BSO in the past. Using Vivelle 0.05 mg patches. Has tried stopping and has unacceptable hot flashes and sweats. I again reviewed the issues of HRT particularly with aging and risks of stroke heart attack DVT. Possible breast cancer risks also discussed. Patient strongly wants to continue she understands the issues but feels her quality of life is poor without them. Vivelle 0.05 mg patches 1 year prescribed. 2. Cystocele/rectocele. Discussed with patient. She does not have any pressure or discomfort symptoms. Options for management include observation,  pessary or surgery discussed. At this point the patient wants to monitor and will follow up if she develops any symptoms. 3. Urinary incontinence. Patient does have some stress like symptoms with loss of urine with coughing sneezing laughing. Discussed options with this to include referral to consider surgery as well as behavior modification. At this point patient is not interested as she does not feel her symptoms warrant this. Check baseline urinalysis. 4. Headaches. Patients getting frequent headaches throughout the week. Does not seem to have a trigger. No overt neurologic associated symptoms. Recommend follow up with neurologist and we will help her make this appointment. She knows if she does not hear within 2 weeks about the appointment call my office. 5. Mammography due now and patient agrees to schedule.  SBE monthly reviewed. 6. History of recurrent UTIs. Uses trimethoprim at the earliest signs. Has supply at home but will call if she needs more. 7. Pap smear 2013. No Pap smear done today. No history of abnormal Pap smears previously. We both agree to stop screening per current screening guidelines based on age and hysterectomy history. 8. DEXA 2015 normal. Plan repeat at 5 year interval. Increased calcium and vitamin D. 9. Colonoscopy 2014. Repeat at their recommended interval. 10. Health maintenance. No routine blood work done as patient has this done at her primary physician's office. Follow up in one year, sooner as needed.  Greater than 15 minutes of my time was spent in excess of her breast and pelvic exam visit in direct face to face counseling and coordination of care in regards to her problems of cystocele/rectocele, urinary incontinence, headaches and HRT.    Anastasio Auerbach MD, 10:45 AM 11/07/2015

## 2015-11-07 NOTE — Patient Instructions (Signed)

## 2015-11-07 NOTE — Addendum Note (Signed)
Addended by: Joaquin Music on: 11/07/2015 12:08 PM   Modules accepted: Orders

## 2015-11-10 ENCOUNTER — Telehealth: Payer: Self-pay | Admitting: *Deleted

## 2015-11-10 NOTE — Telephone Encounter (Signed)
appt on 12/31/15 @ 9:15am, pt aware. Notes faxed.

## 2015-11-10 NOTE — Telephone Encounter (Signed)
-----   Message from Anastasio Auerbach, MD sent at 11/07/2015 10:52 AM EDT ----- Arrange appointment with Dr. Melton Alar neurologist in reference to frequent headaches

## 2016-01-22 ENCOUNTER — Other Ambulatory Visit: Payer: Self-pay | Admitting: Gynecology

## 2016-06-14 ENCOUNTER — Telehealth: Payer: Self-pay | Admitting: *Deleted

## 2016-06-14 NOTE — Telephone Encounter (Signed)
Pt left message in traige voicemail stating her insurance will no longer cover vivelle-dot patch, I left a message for pt to discuss this.

## 2016-06-22 NOTE — Telephone Encounter (Signed)
Unable to proceed with Prior Authorization as patient medication is covered for 2017 formulary list.

## 2016-07-12 ENCOUNTER — Telehealth: Payer: Self-pay | Admitting: *Deleted

## 2016-07-12 NOTE — Telephone Encounter (Signed)
PA done online for vivelle-dot patch 0.05 mg ,via cover my meds, will wait for response.

## 2016-07-13 NOTE — Telephone Encounter (Signed)
Medication approved thought 07/04/2017, pharmacy informed.

## 2016-10-26 ENCOUNTER — Other Ambulatory Visit: Payer: Self-pay | Admitting: Gynecology

## 2016-11-09 ENCOUNTER — Ambulatory Visit (INDEPENDENT_AMBULATORY_CARE_PROVIDER_SITE_OTHER): Payer: Medicare Other | Admitting: Gynecology

## 2016-11-09 ENCOUNTER — Encounter: Payer: Self-pay | Admitting: Gynecology

## 2016-11-09 VITALS — BP 124/74 | Ht 60.5 in | Wt 188.0 lb

## 2016-11-09 DIAGNOSIS — N952 Postmenopausal atrophic vaginitis: Secondary | ICD-10-CM | POA: Diagnosis not present

## 2016-11-09 DIAGNOSIS — Z79890 Hormone replacement therapy (postmenopausal): Secondary | ICD-10-CM

## 2016-11-09 DIAGNOSIS — Z01411 Encounter for gynecological examination (general) (routine) with abnormal findings: Secondary | ICD-10-CM | POA: Diagnosis not present

## 2016-11-09 DIAGNOSIS — N8111 Cystocele, midline: Secondary | ICD-10-CM | POA: Diagnosis not present

## 2016-11-09 DIAGNOSIS — N816 Rectocele: Secondary | ICD-10-CM | POA: Diagnosis not present

## 2016-11-09 MED ORDER — TRIMETHOPRIM 100 MG PO TABS: 100.0000 mg | ORAL_TABLET | Freq: Every day | ORAL | 2 refills | Status: DC | PRN

## 2016-11-09 MED ORDER — ESTRADIOL 0.05 MG/24HR TD PTTW: MEDICATED_PATCH | TRANSDERMAL | 11 refills | Status: DC

## 2016-11-09 NOTE — Progress Notes (Signed)
    Suzanne Russell June 04, 1936 [id number]        80 y.o.  G2P2002 for annual exam.    Past medical history,surgical history, problem list, medications, allergies, family history and social history were all reviewed and documented as reviewed in the EPIC chart.  ROS:  Performed with pertinent positives and negatives included in the history, assessment and plan.   Additional significant findings :  None   Exam: Caryn Bee assistant Vitals:   11/09/16 1059  BP: 124/74  Weight: 188 lb (85.3 kg)  Height: 5' 0.5" (1.537 m)   Body mass index is 36.11 kg/m.  General appearance:  Normal affect, orientation and appearance. Skin: Grossly normal HEENT: Without gross lesions.  No cervical or supraclavicular adenopathy. Thyroid normal.  Lungs:  Clear without wheezing, rales or rhonchi Cardiac: RR, without RMG Abdominal:  Soft, nontender, without masses, guarding, rebound, organomegaly or hernia Breasts:  Examined lying and sitting without masses, retractions, discharge or axillary adenopathy. Pelvic:  Ext, BUS, Vagina: With first to second-degree cystocele. First degree rectocele. General atrophic changes. Cuff well supported  Adnexa: Without masses or tenderness    Anus and perineum: Normal   Rectovaginal: Normal sphincter tone without palpated masses or tenderness.    Assessment/Plan:  81 y.o. [id number] female for annual exam.   1. Postmenopausal/HRT. Continues on Vivelle 0.05 mg patches. History of TVH/BSO in the past. I reviewed the most current data on HRT the most recent 2017 names guidelines. Benefits as far symptom relief and possible cardiovascular bone health when started early versus risks to include  thrombosis such as stroke heart attack DVT and possible breast cancer issues. Patient feels very strongly that she wants to continue understanding and accepting the risks. Refill 1 year provided. 2. Cystocele/rectocele. Stable on serial exams. Patient without symptoms of pressure or  discomfort. We'll continue to monitor and follow up if any issues. 3. Mammography 09/2014. Patient is in the process of arranging for her mammogram now in [facility]. SBE monthly reviewed. 4. Pap smear 2013. No Pap smear done today. No history of significant abnormal Pap smears previously. Per current screening guidelines based on age and hysterectomy history were both agree to stop screening. 5. DEXA 2015 normal. Plan repeat DEXA at 5 year interval. Increased calcium vitamin D. 6. History of recurrent UTIs in the past. Uses trimethoprim for one to 2 days at the very earliest onset of symptoms which resolves her symptoms. #30 with 2 refills provided. 7. Health maintenance. No routine lab work done as patient does this elsewhere. Follow up 1 year, sooner as needed.    Anastasio Auerbach MD, 11:30 AM 11/09/2016

## 2016-11-09 NOTE — Patient Instructions (Signed)
Followup in one year for annual exam, sooner if any issues 

## 2017-07-12 ENCOUNTER — Telehealth: Payer: Self-pay | Admitting: *Deleted

## 2017-07-12 NOTE — Telephone Encounter (Signed)
Prior authorization done via cover my meds for vivelle dot patch 0.05 mg tablet, will wait for response.

## 2017-07-13 NOTE — Telephone Encounter (Signed)
Patch approved through 07/04/18, pharmacy informed as well.

## 2017-09-28 ENCOUNTER — Other Ambulatory Visit: Payer: Self-pay | Admitting: Gynecology

## 2017-11-22 ENCOUNTER — Other Ambulatory Visit: Payer: Self-pay | Admitting: Gynecology

## 2017-12-06 ENCOUNTER — Other Ambulatory Visit: Payer: Self-pay | Admitting: Gynecology

## 2017-12-06 NOTE — Telephone Encounter (Signed)
CE is scheduled with Dr. Loetta Rough for 12/09/17.

## 2017-12-09 ENCOUNTER — Encounter: Payer: Self-pay | Admitting: Gynecology

## 2017-12-09 ENCOUNTER — Ambulatory Visit: Payer: Medicare Other | Admitting: Gynecology

## 2017-12-09 VITALS — BP 124/80 | Ht 61.0 in | Wt 186.0 lb

## 2017-12-09 DIAGNOSIS — Z79890 Hormone replacement therapy (postmenopausal): Secondary | ICD-10-CM

## 2017-12-09 DIAGNOSIS — Z01419 Encounter for gynecological examination (general) (routine) without abnormal findings: Secondary | ICD-10-CM

## 2017-12-09 DIAGNOSIS — N8111 Cystocele, midline: Secondary | ICD-10-CM

## 2017-12-09 DIAGNOSIS — N952 Postmenopausal atrophic vaginitis: Secondary | ICD-10-CM | POA: Diagnosis not present

## 2017-12-09 DIAGNOSIS — N816 Rectocele: Secondary | ICD-10-CM | POA: Diagnosis not present

## 2017-12-09 MED ORDER — ESTRADIOL 0.05 MG/24HR TD PTTW: MEDICATED_PATCH | TRANSDERMAL | 11 refills | Status: DC

## 2017-12-09 NOTE — Patient Instructions (Addendum)
Try using one half of the estrogen patch and see how you do with this.  It would decrease your exposure to the estrogen and any risks associated with this.  Call and schedule your mammogram as we discussed.

## 2017-12-09 NOTE — Progress Notes (Signed)
    Suzanne Russell Jan 20, 1936 [id number]        81 y.o.  [id number] for annual gynecologic exam.  Doing well without gynecologic complaints.  Past medical history,surgical history, problem list, medications, allergies, family history and social history were all reviewed and documented as reviewed in the EPIC chart.  ROS:  Performed with pertinent positives and negatives included in the history, assessment and plan.   Additional significant findings : None   Exam: Caryn Bee assistant Vitals:   12/09/17 1156  BP: 124/80  Weight: 186 lb (84.4 kg)  Height: 5\' 1"  (1.549 m)   Body mass index is 35.14 kg/m.  General appearance:  Normal affect, orientation and appearance. Skin: Grossly normal HEENT: Without gross lesions.  No cervical or supraclavicular adenopathy. Thyroid normal.  Lungs:  Clear without wheezing, rales or rhonchi Cardiac: RR, without RMG Abdominal:  Soft, nontender, without masses, guarding, rebound, organomegaly or hernia Breasts:  Examined lying and sitting without masses, retractions, discharge or axillary adenopathy. Pelvic:  Ext, BUS, Vagina: With atrophic changes.  First to second-degree cystocele.  First-degree rectocele.  Cuff well supported.  Adnexa: Without masses or tenderness    Anus and perineum: Normal   Rectovaginal: Normal sphincter tone without palpated masses or tenderness.    Assessment/Plan:  82 y.o. G32P2002 female for annual gynecologic exam.   1. Postmenopausal/atrophic genital changes/HRT.  Continues on Vivelle 0.05 mg patches.  Status post TVH BSO in the past.  I again reviewed the issues of HRT and risks particularly stroke heart attack DVT.  Increasing risk with advancing age possibility discussed.  The cancer issue also reviewed.  Patient strongly wants to continue.  I did ask her to cut the patch in half and try one half patch to see if she does as well with this and if so continue on this.  Refill x1 year provided. 2. Mammography 2016.   Reminded patient she is overdue and she agrees to call and schedule.  Breast exam normal today. 3. Cystocele/rectocele.  Asymptomatic to the patient.  Stable on serial exams.  Continue to monitor and report any symptoms. 4. Pap smear 2013.  No Pap smear done today.  No history of abnormal Pap smears.  We both agree to stop screening per current screening guidelines based on age and hysterectomy history. 5. DEXA 2015 normal.  Plan repeat DEXA at 5-year interval. 6. Colonoscopy 2014.  Repeat at their recommended interval. 7. Health maintenance.  No routine lab work done as patient does this elsewhere.  Follow-up 1 year, sooner as needed.   Anastasio Auerbach MD, 12:22 PM 12/09/2017

## 2018-05-02 ENCOUNTER — Telehealth: Payer: Self-pay | Admitting: *Deleted

## 2018-05-02 NOTE — Telephone Encounter (Signed)
Medication approved until 07/05/2019, pharmacy informed.

## 2018-05-02 NOTE — Telephone Encounter (Signed)
Prior authorization done via cover my meds for vivelle dot patch twice weekly 0.05mg , will wait for response.

## 2018-12-19 ENCOUNTER — Other Ambulatory Visit: Payer: Self-pay | Admitting: Gynecology

## 2018-12-28 ENCOUNTER — Encounter: Payer: Medicare Other | Admitting: Gynecology

## 2019-02-28 ENCOUNTER — Encounter: Payer: Medicare Other | Admitting: Gynecology

## 2019-03-28 ENCOUNTER — Telehealth: Payer: Self-pay

## 2019-03-28 NOTE — Telephone Encounter (Signed)
Patient said her Vivelle Patch has been prior authorized by the insurance co for the rest of the year but she has no refills left on it. Her last CE was 12/09/2017.    She is 83 yo and said she is nervous about coming out because of Covid and asked if you will refill it.

## 2019-03-29 MED ORDER — ESTRADIOL 0.05 MG/24HR TD PTTW: MEDICATED_PATCH | TRANSDERMAL | 0 refills | Status: AC

## 2019-03-29 NOTE — Telephone Encounter (Signed)
I think now would be a good time for her to wean off of estrogen.  I would recommend cutting the patch in half and using half a patch for a month and then cut it again in half using a quarter patch for a month and then stopping.

## 2019-03-29 NOTE — Telephone Encounter (Signed)
Spoke with patient and read her Dr. Dorette Grate note. Refill sent with instructions to wean.

## 2019-03-29 NOTE — Telephone Encounter (Signed)
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  We cannot base our decisions based on past recommendations.  At 78 I think it is a good idea that she weans off of hormones.

## 2019-03-29 NOTE — Telephone Encounter (Signed)
Patient was not happy with your request for her to try to wean off patch. She said she did try that not too long ago with Dr. Ubaldo Glassing and was miserable. She said Dr. Ubaldo Glassing told her that it looked like she "would be one of the 1/4 of women who have to be on estrogen their whole life".  She said she has other health issues and this is not something else she wants to add to give her more to deal with. Said she would even sign a waiver taking responsibility from you for her being on it.

## 2019-04-12 ENCOUNTER — Encounter: Payer: Self-pay | Admitting: Gynecology
